# Patient Record
Sex: Female | Born: 1960
Health system: Southern US, Community
[De-identification: ages and names within clinical notes are randomized; demographics above are authoritative.]

## PROBLEM LIST (undated history)

## (undated) DIAGNOSIS — S62101A Fracture of unspecified carpal bone, right wrist, initial encounter for closed fracture: Secondary | ICD-10-CM

## (undated) DIAGNOSIS — D649 Anemia, unspecified: Secondary | ICD-10-CM

## (undated) DIAGNOSIS — Z5189 Encounter for other specified aftercare: Secondary | ICD-10-CM

## (undated) HISTORY — PX: ABDOMINAL HYSTERECTOMY: SHX81

## (undated) HISTORY — DX: Anemia, unspecified: D64.9

## (undated) HISTORY — PX: POLYPECTOMY: SHX149

## (undated) HISTORY — DX: Fracture of unspecified carpal bone, right wrist, initial encounter for closed fracture: S62.101A

## (undated) HISTORY — PX: MOUTH SURGERY: SHX715

## (undated) HISTORY — PX: COLONOSCOPY: SHX174

## (undated) HISTORY — DX: Encounter for other specified aftercare: Z51.89

---

## 1983-04-22 HISTORY — PX: KNEE LIGAMENT RECONSTRUCTION: SHX1895

## 1996-04-21 HISTORY — PX: BIOPSY BREAST: PRO8

## 2005-04-21 DIAGNOSIS — S62101A Fracture of unspecified carpal bone, right wrist, initial encounter for closed fracture: Secondary | ICD-10-CM

## 2005-04-21 HISTORY — DX: Fracture of unspecified carpal bone, right wrist, initial encounter for closed fracture: S62.101A

## 2005-11-16 ENCOUNTER — Emergency Department (HOSPITAL_COMMUNITY): Admission: EM | Admit: 2005-11-16 | Discharge: 2005-11-16 | Payer: Self-pay | Admitting: Emergency Medicine

## 2009-01-10 ENCOUNTER — Encounter: Payer: Self-pay | Admitting: Internal Medicine

## 2009-01-11 ENCOUNTER — Encounter: Payer: Self-pay | Admitting: Internal Medicine

## 2009-08-20 ENCOUNTER — Ambulatory Visit: Payer: Self-pay | Admitting: Internal Medicine

## 2009-08-20 DIAGNOSIS — R002 Palpitations: Secondary | ICD-10-CM | POA: Insufficient documentation

## 2009-08-20 DIAGNOSIS — D509 Iron deficiency anemia, unspecified: Secondary | ICD-10-CM | POA: Insufficient documentation

## 2009-08-23 ENCOUNTER — Telehealth (INDEPENDENT_AMBULATORY_CARE_PROVIDER_SITE_OTHER): Payer: Self-pay | Admitting: *Deleted

## 2009-09-14 ENCOUNTER — Ambulatory Visit: Payer: Self-pay | Admitting: Cardiovascular Disease

## 2009-09-14 ENCOUNTER — Encounter: Payer: Self-pay | Admitting: Internal Medicine

## 2009-09-14 ENCOUNTER — Ambulatory Visit: Payer: Self-pay

## 2009-09-14 ENCOUNTER — Ambulatory Visit (HOSPITAL_COMMUNITY): Admission: RE | Admit: 2009-09-14 | Discharge: 2009-09-14 | Payer: Self-pay | Admitting: Internal Medicine

## 2009-09-27 ENCOUNTER — Encounter: Admission: RE | Admit: 2009-09-27 | Discharge: 2009-09-27 | Payer: Self-pay | Admitting: Obstetrics & Gynecology

## 2010-05-21 NOTE — Assessment & Plan Note (Signed)
Summary: NEW / [employer] / # / CD   Vital Signs:  Patient profile:   50 year old female Weight:      144.13 pounds O2 Sat:      97 % on Room air Temp:     97.7 degrees F oral Pulse rate:   71 / minute BP sitting:   112 / 70  (left arm) Cuff size:   large  Vitals Entered By: Lucious Groves (Aug 20, 2009 2:09 PM)  O2 Flow:  Room air CC: NP--Est care./kb   CC:  NP--Est care./kb.  History of Present Illness: The patient presents for a wellness examination   Preventive Screening-Counseling & Management  Alcohol-Tobacco     Smoking Status: never      Drug Use:  no.    Current Medications (verified): 1)  None  Allergies (verified): No Known Drug Allergies  Past History:  Past Medical History: Anemia-iron deficiency  Past Surgical History: L knee ligament replacement 1985 Breast Biopsy 1998  Family History: M  and aunt died ??MI, HTN 78 sudden death F died pneumonia 56  Social History: Occupation: Airline pilot - from [place] Married 2 chilldren; h/o parachute sports Never Smoked Alcohol use-no Drug use-no Smoking Status:  never Drug Use:  no  Review of Systems  The patient denies anorexia, fever, weight loss, weight gain, vision loss, decreased hearing, hoarseness, chest pain, syncope, dyspnea on exertion, peripheral edema, prolonged cough, headaches, hemoptysis, abdominal pain, melena, hematochezia, severe indigestion/heartburn, hematuria, incontinence, genital sores, muscle weakness, suspicious skin lesions, transient blindness, difficulty walking, depression, unusual weight change, abnormal bleeding, enlarged lymph nodes, angioedema, and breast masses.    Physical Exam  General:  Well-developed,well-nourished,in no acute distress; alert,appropriate and cooperative throughout examination Head:  Normocephalic and atraumatic without obvious abnormalities. No apparent alopecia or balding. Eyes:  No corneal or conjunctival inflammation noted. EOMI. Perrla. Funduscopic  exam benign, without hemorrhages, exudates or papilledema. Vision grossly normal. Ears:  External ear exam shows no significant lesions or deformities.  Otoscopic examination reveals clear canals, tympanic membranes are intact bilaterally without bulging, retraction, inflammation or discharge. Hearing is grossly normal bilaterally. Nose:  External nasal examination shows no deformity or inflammation. Nasal mucosa are pink and moist without lesions or exudates. Mouth:  Oral mucosa and oropharynx without lesions or exudates.  Teeth in good repair. Neck:  No deformities, masses, or tenderness noted. Lungs:  Normal respiratory effort, chest expands symmetrically. Lungs are clear to auscultation, no crackles or wheezes. Heart:  Normal rate and regular rhythm. S1 and S2 normal without gallop, murmur, click, rub or other extra sounds. Abdomen:  Bowel sounds positive,abdomen soft and non-tender without masses, organomegaly or hernias noted. Msk:  No deformity or scoliosis noted of thoracic or lumbar spine.   Pulses:  R and L carotid,radial,femoral,dorsalis pedis and posterior tibial pulses are full and equal bilaterally Extremities:  No clubbing, cyanosis, edema, or deformity noted with normal full range of motion of all joints.   Neurologic:  No cranial nerve deficits noted. Station and gait are normal. Plantar reflexes are down-going bilaterally. DTRs are symmetrical throughout. Sensory, motor and coordinative functions appear intact. Skin:  Intact without suspicious lesions or rashes Cervical Nodes:  No lymphadenopathy noted Inguinal Nodes:  No significant adenopathy Psych:  Cognition and judgment appear intact. Alert and cooperative with normal attention span and concentration. No apparent delusions, illusions, hallucinations   Impression & Recommendations:  Problem # 1:  PHYSICAL EXAMINATION (ICD-V70.0) Assessment New Health and age related issues were discussed.  Available screening tests and  vaccinations were discussed as well. Healthy life style including good diet and execise was discussed.  The labs  from before were reviewed with the patient.  EKG was nl  Problem # 2:  ANEMIA-IRON DEFICIENCY (ICD-280.9) h/o Assessment: Comment Only  Problem # 3:  PALPITATIONS (ICD-785.1) Assessment: Comment Only I'm concerned about her cardiac fam. history. Will start w/ECHO. ASA once daily. She declined Card consult. Orders: Echo Referral (Echo)  Complete Medication List: 1)  Vitamin D 1000 Unit Tabs (Cholecalciferol) .Marland Kitchen.. 1 by mouth qd 2)  Aspirin 81 Mg Tbec (Aspirin) .Marland Kitchen.. 1 by mouth qd  Patient Instructions: 1)  Please schedule a follow-up appointment in 1 year well w/labs.  Prevention & Chronic Care Immunizations   Influenza vaccine: Not documented    Tetanus booster: Not documented    Pneumococcal vaccine: Not documented  Other Screening   Pap smear: Not documented    Mammogram: Not documented   Smoking status: never  (08/20/2009)  Lipids   Total Cholesterol: Not documented   LDL: Not documented   LDL Direct: Not documented   HDL: Not documented   Triglycerides: Not documented

## 2010-05-21 NOTE — Progress Notes (Signed)
----   Converted from flag ---- ---- 08/23/2009 9:20 AM, Edman Circle wrote: appt 5/11 @ 3:00  ---- 08/23/2009 8:48 AM, Dagoberto Reef wrote: Thanks  ---- 08/23/2009 8:01 AM, Georgina Quint Plotnikov MD wrote: The following orders have been entered for this patient and placed on Admin Hold:  Type:     Referral       Code:   Echo Description:   Echo Referral Order Date:   08/20/2009   Authorized By:   Tresa Garter MD Order #:   [id number] Clinical Notes:   Type: Cardiac ECHO  Special instructions: Dx palpitation H/o sudden death in the family ------------------------------

## 2010-10-14 ENCOUNTER — Encounter: Payer: Self-pay | Admitting: Internal Medicine

## 2010-12-06 ENCOUNTER — Other Ambulatory Visit (INDEPENDENT_AMBULATORY_CARE_PROVIDER_SITE_OTHER): Payer: BC Managed Care – PPO

## 2010-12-06 ENCOUNTER — Other Ambulatory Visit: Payer: Self-pay | Admitting: Internal Medicine

## 2010-12-06 ENCOUNTER — Ambulatory Visit (INDEPENDENT_AMBULATORY_CARE_PROVIDER_SITE_OTHER): Payer: BC Managed Care – PPO | Admitting: Internal Medicine

## 2010-12-06 ENCOUNTER — Encounter: Payer: Self-pay | Admitting: Internal Medicine

## 2010-12-06 ENCOUNTER — Ambulatory Visit (INDEPENDENT_AMBULATORY_CARE_PROVIDER_SITE_OTHER)
Admission: RE | Admit: 2010-12-06 | Discharge: 2010-12-06 | Disposition: A | Discharge status: Home / Self Care | Payer: BC Managed Care – PPO | Source: Ambulatory Visit | Attending: Internal Medicine | Admitting: Internal Medicine

## 2010-12-06 DIAGNOSIS — M25562 Pain in left knee: Secondary | ICD-10-CM

## 2010-12-06 DIAGNOSIS — M25561 Pain in right knee: Secondary | ICD-10-CM

## 2010-12-06 DIAGNOSIS — Z0000 Encounter for general adult medical examination without abnormal findings: Secondary | ICD-10-CM

## 2010-12-06 DIAGNOSIS — M25569 Pain in unspecified knee: Secondary | ICD-10-CM

## 2010-12-06 DIAGNOSIS — G43909 Migraine, unspecified, not intractable, without status migrainosus: Secondary | ICD-10-CM

## 2010-12-06 LAB — CBC WITH DIFFERENTIAL/PLATELET
Basophils Relative: 0.5 % (ref 0.0–3.0)
Eosinophils Relative: 0.3 % (ref 0.0–5.0)
Hemoglobin: 13.1 g/dL (ref 12.0–15.0)
MCV: 88.1 fl (ref 78.0–100.0)
Monocytes Relative: 6.2 % (ref 3.0–12.0)
Neutro Abs: 3.9 10*3/uL (ref 1.4–7.7)
Neutrophils Relative %: 63.9 % (ref 43.0–77.0)
Platelets: 199 10*3/uL (ref 150.0–400.0)
WBC: 6 10*3/uL (ref 4.5–10.5)

## 2010-12-06 LAB — COMPREHENSIVE METABOLIC PANEL
ALT: 17 U/L (ref 0–35)
Alkaline Phosphatase: 67 U/L (ref 39–117)
Calcium: 9 mg/dL (ref 8.4–10.5)
Creatinine, Ser: 0.7 mg/dL (ref 0.4–1.2)
Potassium: 4.2 mEq/L (ref 3.5–5.1)

## 2010-12-06 LAB — URINALYSIS
Bilirubin Urine: NEGATIVE
Ketones, ur: NEGATIVE
Leukocytes, UA: NEGATIVE
Nitrite: NEGATIVE
Specific Gravity, Urine: <=1.005 (ref 1.000–1.030)
Total Protein, Urine: NEGATIVE
Urine Glucose: NEGATIVE
pH: 6 (ref 5.0–8.0)

## 2010-12-06 LAB — LIPID PANEL: VLDL: 26.2 mg/dL (ref 0.0–40.0)

## 2010-12-06 NOTE — Patient Instructions (Signed)
Chondroitin tabs for knees Vit C for bruising

## 2010-12-06 NOTE — Progress Notes (Signed)
  Subjective:    Patient ID: Kayla Tate, female    DOB: 06/10/1960, 50 y.o.   MRN: [id number]  HPI  The patient is here for a wellness exam. The patient has been doing well overall without major physical or psychological issues going on lately.  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  Negative for fever, chills, diaphoresis, activity change, appetite change, fatigue and unexpected weight change.  HENT: Negative for hearing loss, ear pain, nosebleeds, congestion, sore throat, facial swelling, rhinorrhea, sneezing, mouth sores, trouble swallowing, neck pain, neck stiffness, postnasal drip, sinus pressure and tinnitus.   Eyes: Negative for pain, discharge, redness, itching and visual disturbance.  Respiratory: Negative for cough, chest tightness, shortness of breath, wheezing and stridor.   Cardiovascular: Negative for chest pain, palpitations and leg swelling.  Gastrointestinal: Negative for nausea, diarrhea, constipation, blood in stool, abdominal distention, anal bleeding and rectal pain.  Genitourinary: Negative for dysuria, urgency, frequency, hematuria, flank pain, vaginal bleeding, vaginal discharge, difficulty urinating, genital sores and pelvic pain.  Musculoskeletal: Positive for joint swelling (L knee), arthralgias (L knee) and gait problem (L knee hurts). Negative for back pain.  Skin: Negative.  Negative for rash.  Neurological: Negative for dizziness, tremors, seizures, syncope, speech difficulty, weakness, numbness and headaches.  Hematological: Negative for adenopathy. Does not bruise/bleed easily.  Psychiatric/Behavioral: Negative for suicidal ideas, behavioral problems, sleep disturbance, dysphoric mood and decreased concentration. The patient is not  nervous/anxious.        Objective:   Physical Exam  Constitutional: She appears well-developed and well-nourished. No distress.  HENT:  Head: Normocephalic.  Right Ear: External ear normal.  Left Ear: External ear normal.  Nose: Nose normal.  Mouth/Throat: Oropharynx is clear and moist.  Eyes: Conjunctivae are normal. Pupils are equal, round, and reactive to light. Right eye exhibits no discharge. Left eye exhibits no discharge.  Neck: Normal range of motion. Neck supple. No JVD present. No tracheal deviation present. No thyromegaly present.  Cardiovascular: Normal rate, regular rhythm and normal heart sounds.   Pulmonary/Chest: No stridor. No respiratory distress. She has no wheezes.  Abdominal: Soft. Bowel sounds are normal. She exhibits no distension and no mass. There is no tenderness. There is no rebound and no guarding.  Musculoskeletal: She exhibits tenderness. She exhibits no edema.       L knee is deformed with a scar present, small effusion, stiff and a little tender w/ROM  Lymphadenopathy:    She has no cervical adenopathy.  Neurological: She displays normal reflexes. No cranial nerve deficit. She exhibits normal muscle tone. Coordination normal.  Skin: No rash noted. No erythema.  Psychiatric: She has a normal mood and affect. Her behavior is normal. Judgment and thought content normal.          Assessment & Plan:

## 2010-12-07 DIAGNOSIS — G43909 Migraine, unspecified, not intractable, without status migrainosus: Secondary | ICD-10-CM | POA: Insufficient documentation

## 2010-12-07 NOTE — Assessment & Plan Note (Signed)
NSAIDs prn Declined Ortho consult

## 2010-12-07 NOTE — Assessment & Plan Note (Signed)
GYN q 12 mo

## 2010-12-07 NOTE — Assessment & Plan Note (Signed)
Ibuprofen prn 

## 2010-12-07 NOTE — Assessment & Plan Note (Signed)
Vit D Try glucosamine

## 2010-12-09 LAB — LDL CHOLESTEROL, DIRECT: Direct LDL: 140.4 mg/dL

## 2011-01-08 ENCOUNTER — Encounter: Payer: Self-pay | Admitting: Internal Medicine

## 2011-01-08 ENCOUNTER — Ambulatory Visit (INDEPENDENT_AMBULATORY_CARE_PROVIDER_SITE_OTHER): Payer: BC Managed Care – PPO | Admitting: Internal Medicine

## 2011-01-08 ENCOUNTER — Telehealth: Payer: Self-pay | Admitting: *Deleted

## 2011-01-08 VITALS — BP 112/74 | HR 61 | Temp 97.9°F | Resp 16 | Wt 139.2 lb

## 2011-01-08 DIAGNOSIS — T63461A Toxic effect of venom of wasps, accidental (unintentional), initial encounter: Secondary | ICD-10-CM

## 2011-01-08 DIAGNOSIS — T63441A Toxic effect of venom of bees, accidental (unintentional), initial encounter: Secondary | ICD-10-CM

## 2011-01-08 DIAGNOSIS — T6391XA Toxic effect of contact with unspecified venomous animal, accidental (unintentional), initial encounter: Secondary | ICD-10-CM

## 2011-01-08 MED ORDER — CETIRIZINE HCL 10 MG PO TABS: 10.0000 mg | ORAL_TABLET | Freq: Every day | ORAL | Status: DC

## 2011-01-08 NOTE — Assessment & Plan Note (Signed)
She was given depo-medrol IM and will start zyrtec

## 2011-01-08 NOTE — Telephone Encounter (Signed)
Spoke w/Husband. He spoke w/pt who is currently at work. Pt was stung by a bee 2 days ago. Now c/o itching and redness that has increased into her hand. NO SOB or throat tightness. Scheduled for OV today for eval.

## 2011-01-08 NOTE — Patient Instructions (Signed)
Allergic Reaction, Localized, Insect An insect sting can cause pain, redness and itching at the sting site. Symptoms of a local reaction are usually contained to the area of the sting site. A localized allergic reaction usually occurs within minutes of an insect sting.  SYMPTOMS   A local reaction at the sting site can cause:   Pain.   Redness.   Itching.   Swelling.   A systematic reaction can cause a reaction anywhere on your body. For example, you may develop the following:   Hives.   Generalized swelling.   Body aches.   Itching.   Dizziness.   Nausea or vomiting.   A more serious (anaphylactic) reaction can involve:   Difficulty breathing or wheezing.   Tongue or throat swelling.   Fainting.  HOME CARE INSTRUCTIONS  If you are stung, look to see if the stinger is still in the skin. This can appear as a small black dot at the sting site. The stinger can be removed by scraping it with a dull object such as a credit card or your fingernail. Do not use tweezers. Tweezers can squeeze the stinger and release more insect venom into the skin.   After the stinger has been removed, wash the sting site with soap and water or rubbing alcohol.   Apply ice to the area of the sting for 100.5 minutes. Place ice in a bag and put a towel between the bag and your skin. Do this 100.5 times per day. Applying ice can help relieve pain to the sting site.   Redness and swelling of the sting site may last as long as a week   A topical anti-itch cream like hydrocortisone cream can help reduce skin itching.   An oral anti-histamine medication can help decrease swelling or other related symptoms.   Only take (or give your child) over -the-counter or prescription medicines for pain, discomfort, or fever as told by your caregiver.  FOLLOW-UP CARE  If prescribed, carry an injectable epinephrine (EpiPen) with you at all times! It is important to know how and when to use the EpiPen.    Avoid contact with stinging insects or the insect thought to have caused this reaction.   Wear long pants when mowing grass or hiking. Wear gloves when gardening.   Use unscented deodorant and avoid strong perfumes when outdoors.   Wear a medical identification bracelet or necklace that describes your allergies or medical conditions.   Make sure your primary caregiver has a record of your insect sting reaction.   It may be helpful consult with an allergist. You may have other sensitivities that you are not aware of. To locate an allergist near you, contact:   The American Academy of Allergy, Asthma & Immunology [www.aaaai.org / (800) 617-628-8516] or   The American College of Allergy, Asthma & Immunology [www.acaai.org / (800) (929)237-6856].  seek immediate medical care IF:  The following may be early warning signs of a serious generalized or anaphylactic reaction. Call your local emergency service immediately!   You experience wheezing and/or difficulty breathing.   You have difficulty swallowing, or throat tightness.   You have mouth, tongue or throat swelling.   You feel weak, faint or pass out.   You have coughing or a change in your voice.   You experience vomiting, diarrhea, or stomach cramps.   You have chest pain or lightheadedness.   You notice raised red patches on the skin that itch.  MAKE SURE YOU:  Understand these instructions.   Will watch your condition.   Will get help right away if you are not doing well or get worse.  Document Released: 03/06/2006 Document Re-Released: 04/29/2009 [facility] Patient Information 2011 [place], [place].

## 2011-01-08 NOTE — Progress Notes (Signed)
  Subjective:    Patient ID: Kayla Tate, female    DOB: July 13, 1960, 50 y.o.   MRN: [id number]  HPI She complains of a bee sting on her right hand 5 days ago that itches and the swelling has spread to the right ring finger, she has not started any meds at home for the symptoms or the allergic reaction.   Review of Systems  Constitutional: Negative.   HENT: Negative.   Eyes: Negative.   Respiratory: Negative.   Cardiovascular: Negative.   Gastrointestinal: Negative.   Genitourinary: Negative.   Musculoskeletal: Negative.   Skin: Positive for color change (right hand). Negative for pallor, rash and wound.  Neurological: Negative.   Hematological: Negative.   Psychiatric/Behavioral: Negative.        Objective:   Physical Exam  Vitals reviewed. Constitutional: She is oriented to person, place, and time. She appears well-developed and well-nourished. No distress.  HENT:  Mouth/Throat: Oropharynx is clear and moist. No oropharyngeal exudate.  Eyes: Conjunctivae are normal. Right eye exhibits no discharge. Left eye exhibits no discharge. No scleral icterus.  Neck: Normal range of motion. Neck supple. No JVD present. No tracheal deviation present. No thyromegaly present.  Cardiovascular: Normal rate, regular rhythm, normal heart sounds and intact distal pulses.  Exam reveals no gallop and no friction rub.   No murmur heard. Pulmonary/Chest: Effort normal and breath sounds normal. No stridor. No respiratory distress. She has no wheezes. She has no rales. She exhibits no tenderness.  Abdominal: Soft. Bowel sounds are normal. She exhibits no distension and no mass. There is no tenderness. There is no rebound and no guarding.  Musculoskeletal: Normal range of motion. She exhibits no edema and no tenderness.       Right hand: She exhibits deformity and swelling. She exhibits normal range of motion, no bony tenderness and no laceration.       Hands:      Right hand shows mild erythema  and swelling from the dorsum of the 4th MCP joint onto the ring finger, there is no warmth, exudate, pustules, vesicles, wounds, streaking, or ttp. She has good capillary refill in the ring finger and there is good flexion and extension  Lymphadenopathy:    She has no cervical adenopathy.  Neurological: She is oriented to person, place, and time. She displays normal reflexes. She exhibits normal muscle tone.  Skin: Skin is warm and dry. No rash noted. She is not diaphoretic. No erythema. No pallor.  Psychiatric: She has a normal mood and affect. Her behavior is normal. Judgment and thought content normal.          Assessment & Plan:

## 2011-02-14 ENCOUNTER — Ambulatory Visit: Payer: BC Managed Care – PPO | Admitting: Internal Medicine

## 2011-11-27 ENCOUNTER — Other Ambulatory Visit: Payer: Self-pay | Admitting: Obstetrics & Gynecology

## 2011-11-27 DIAGNOSIS — Z1231 Encounter for screening mammogram for malignant neoplasm of breast: Secondary | ICD-10-CM

## 2011-12-08 ENCOUNTER — Ambulatory Visit
Admission: RE | Admit: 2011-12-08 | Discharge: 2011-12-08 | Disposition: A | Discharge status: Home / Self Care | Payer: BC Managed Care – PPO | Source: Ambulatory Visit | Attending: Obstetrics & Gynecology | Admitting: Obstetrics & Gynecology

## 2011-12-08 DIAGNOSIS — Z1231 Encounter for screening mammogram for malignant neoplasm of breast: Secondary | ICD-10-CM

## 2012-01-19 ENCOUNTER — Encounter: Payer: BC Managed Care – PPO | Admitting: Internal Medicine

## 2012-03-16 ENCOUNTER — Ambulatory Visit (INDEPENDENT_AMBULATORY_CARE_PROVIDER_SITE_OTHER): Payer: BC Managed Care – PPO | Admitting: Internal Medicine

## 2012-03-16 ENCOUNTER — Encounter: Payer: Self-pay | Admitting: Internal Medicine

## 2012-03-16 VITALS — BP 108/70 | HR 60 | Temp 97.6°F | Resp 16 | Ht 64.0 in | Wt 140.0 lb

## 2012-03-16 DIAGNOSIS — Z0000 Encounter for general adult medical examination without abnormal findings: Secondary | ICD-10-CM

## 2012-03-16 DIAGNOSIS — Z1211 Encounter for screening for malignant neoplasm of colon: Secondary | ICD-10-CM

## 2012-03-16 DIAGNOSIS — G43909 Migraine, unspecified, not intractable, without status migrainosus: Secondary | ICD-10-CM

## 2012-03-16 NOTE — Assessment & Plan Note (Signed)
We discussed age appropriate health related issues, including available/recomended screening tests and vaccinations. We discussed a need for adhering to healthy diet and exercise. Labs/EKG were reviewed/ordered. All questions were answered. Colonosc at 51 yo Opth, GYN, mammo - q 1 year Zostavax info Declined other shots

## 2012-03-16 NOTE — Progress Notes (Signed)
Subjective:    Patient ID: Kayla Tate, female    DOB: [birth year], 51 y.o.   MRN: [id number]  HPI  The patient is here for a wellness exam. The patient has been doing well overall without major physical or psychological issues going on lately.  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  Negative for fever, chills, diaphoresis, activity change, appetite change, fatigue and unexpected weight change.  HENT: Negative for hearing loss, ear pain, nosebleeds, congestion, sore throat, facial swelling, rhinorrhea, sneezing, mouth sores, trouble swallowing, neck pain, neck stiffness, postnasal drip, sinus pressure and tinnitus.   Eyes: Negative for pain, discharge, redness, itching and visual disturbance.  Respiratory: Negative for cough, chest tightness, shortness of breath, wheezing and stridor.   Cardiovascular: Negative for chest pain, palpitations and leg swelling.  Gastrointestinal: Negative for nausea, diarrhea, constipation, blood in stool, abdominal distention, anal bleeding and rectal pain.  Genitourinary: Negative for dysuria, urgency, frequency, hematuria, flank pain, vaginal bleeding, vaginal discharge, difficulty urinating, genital sores and pelvic pain.  Musculoskeletal: Positive for joint swelling (L knee), arthralgias (L knee) and gait problem (L knee hurts). Negative for back pain.  Skin: Negative.  Negative for rash.  Neurological: Negative for dizziness, tremors, seizures, syncope, speech difficulty, weakness, numbness and headaches.    Hematological: Negative for adenopathy. Does not bruise/bleed easily.  Psychiatric/Behavioral: Negative for suicidal ideas, behavioral problems, sleep disturbance, dysphoric mood and decreased concentration. The patient is not nervous/anxious.        Objective:   Physical Exam  Constitutional: She appears well-developed and well-nourished. No distress.  HENT:  Head: Normocephalic.  Right Ear: External ear normal.  Left Ear: External ear normal.  Nose: Nose normal.  Mouth/Throat: Oropharynx is clear and moist.  Eyes: Conjunctivae normal are normal. Pupils are equal, round, and reactive to light. Right eye exhibits no discharge. Left eye exhibits no discharge.  Neck: Normal range of motion. Neck supple. No JVD present. No tracheal deviation present. No thyromegaly present.  Cardiovascular: Normal rate, regular rhythm and normal heart sounds.   Pulmonary/Chest: No stridor. No respiratory distress. She has no wheezes.  Abdominal: Soft. Bowel sounds are normal. She exhibits no distension and no mass. There is no tenderness. There is no rebound and no guarding.  Musculoskeletal: She exhibits tenderness. She exhibits no edema.       L knee is deformed with a scar present, small effusion, stiff and a little tender w/ROM  Lymphadenopathy:    She has no cervical adenopathy.  Neurological: She displays normal reflexes. No cranial nerve deficit. She exhibits normal muscle tone. Coordination normal.  Skin: No rash noted. No erythema.  Psychiatric: She has a normal mood and affect. Her behavior is normal. Judgment and thought content normal.    Lab Results  Component Value Date   WBC 6.0 12/06/2010   HGB 13.1 12/06/2010   HCT 39.5 12/06/2010   PLT 199.0 12/06/2010   GLUCOSE 90 12/06/2010   CHOL 210* 12/06/2010  TRIG 131.0 12/06/2010   HDL 53.10 12/06/2010   LDLDIRECT 140.4 12/06/2010   ALT 17 12/06/2010   AST 25 12/06/2010   NA 139 12/06/2010   K 4.2 12/06/2010   CL 104 12/06/2010   CREATININE 0.7  12/06/2010   BUN 13 12/06/2010   CO2 29 12/06/2010   TSH 2.57 12/06/2010         Assessment & Plan:

## 2012-03-16 NOTE — Assessment & Plan Note (Signed)
Ibuprofen prn 

## 2012-06-05 ENCOUNTER — Other Ambulatory Visit: Payer: Self-pay

## 2012-11-09 ENCOUNTER — Ambulatory Visit (INDEPENDENT_AMBULATORY_CARE_PROVIDER_SITE_OTHER): Payer: BC Managed Care – PPO | Admitting: Internal Medicine

## 2012-11-09 ENCOUNTER — Encounter: Payer: Self-pay | Admitting: Internal Medicine

## 2012-11-09 VITALS — BP 130/88 | HR 76 | Temp 98.0°F | Resp 16 | Wt 132.0 lb

## 2012-11-09 DIAGNOSIS — IMO0002 Reserved for concepts with insufficient information to code with codable children: Secondary | ICD-10-CM | POA: Insufficient documentation

## 2012-11-09 DIAGNOSIS — L03011 Cellulitis of right finger: Secondary | ICD-10-CM

## 2012-11-09 MED ORDER — DOXYCYCLINE HYCLATE 100 MG PO TABS: 100.0000 mg | ORAL_TABLET | Freq: Two times a day (BID) | ORAL | Status: DC

## 2012-11-09 MED ORDER — MUPIROCIN 2 % EX OINT: TOPICAL_OINTMENT | CUTANEOUS | Status: DC

## 2012-11-09 NOTE — Patient Instructions (Signed)
Soak in hypertonic saline or Absent salt 3 times a day

## 2012-11-09 NOTE — Assessment & Plan Note (Addendum)
7/14 vs wart See procedure Empiric abx tDAP

## 2012-11-09 NOTE — Progress Notes (Signed)
  Subjective:    Patient ID: Kayla Tate, female    DOB: [birth year], 52 y.o.   MRN: [id number]  HPI  C/o R 4th dist phalanx - pain and swelling x 2 mo. There was a possibility of a thorn prick prior. There is a callus over this painful swelling   Review of Systems  Constitutional: Negative for fever.  Musculoskeletal: Negative for myalgias and arthralgias.       Objective:   Physical Exam  Constitutional: She appears well-developed. No distress.  Musculoskeletal: She exhibits tenderness. She exhibits no edema.  R 4th dist phalanx - pain and swelling, hyperkeratotic  Skin: No rash noted.  Psychiatric: Judgment normal.      Procedure note:  Incision and Drainage of an Abscess   Indication : a localized collection of pus that is tender and not spontaneously resolving.    Risks including unsuccessful procedure , possible need for a repeat procedure due to pus accumulation, scar formation, and others as well as benefits were explained to the patient in detail. Written consent was obtained/signed.    The patient was placed in a sitting position. The area of a hyperkeratotic abscess was debrided from hyperkeratosis and prepped with povidone-iodine. 0.6 cm incision with #11strait blade was made. No purulent material was expressed.    The wound was dressed with antibiotic ointment and band aid.  Tolerated well. Complications: None.   Wound instructions provided.   Wound instructions : change dressing once a day or twice a day is needed. Change dressing after  shower in the morning.  Pat dry the wound with gauze. Pull out one inch of packing everyday and cut it off. Re-dress wound with antibiotic ointment and Telfa pad or a Band-Aid of appropriate size.   Please contact us if you notice a recollection of pus in the abscess fever and chills increased pain redness red streaks near the abscess increased swelling in the area.     Assessment & Plan:

## 2012-11-14 ENCOUNTER — Encounter: Payer: Self-pay | Admitting: Internal Medicine

## 2012-12-08 ENCOUNTER — Ambulatory Visit: Payer: BC Managed Care – PPO | Admitting: Internal Medicine

## 2013-02-24 ENCOUNTER — Other Ambulatory Visit: Payer: Self-pay

## 2013-06-21 ENCOUNTER — Telehealth: Payer: Self-pay | Admitting: Internal Medicine

## 2013-06-21 DIAGNOSIS — Z1211 Encounter for screening for malignant neoplasm of colon: Secondary | ICD-10-CM

## 2013-06-21 NOTE — Telephone Encounter (Signed)
Ok Thx 

## 2013-06-21 NOTE — Telephone Encounter (Signed)
Pt called request referral for colonoscopy. Pt stated that Dr. Camila Li wanted her to have it done since last ov for a routine check up. Please advise.

## 2013-06-21 NOTE — Telephone Encounter (Signed)
Left detail massage for pt.  

## 2013-06-22 ENCOUNTER — Ambulatory Visit: Payer: BC Managed Care – PPO | Admitting: Family Medicine

## 2013-09-05 ENCOUNTER — Encounter: Payer: Self-pay | Admitting: Internal Medicine

## 2013-09-05 ENCOUNTER — Ambulatory Visit (INDEPENDENT_AMBULATORY_CARE_PROVIDER_SITE_OTHER): Payer: BC Managed Care – PPO | Admitting: Internal Medicine

## 2013-09-05 VITALS — BP 122/80 | HR 72 | Temp 97.5°F | Resp 16 | Wt 144.0 lb

## 2013-09-05 DIAGNOSIS — M25562 Pain in left knee: Secondary | ICD-10-CM

## 2013-09-05 DIAGNOSIS — M25561 Pain in right knee: Secondary | ICD-10-CM

## 2013-09-05 DIAGNOSIS — M25569 Pain in unspecified knee: Secondary | ICD-10-CM

## 2013-09-05 NOTE — Progress Notes (Signed)
Pre visit review using our clinic review tool, if applicable. No additional management support is needed unless otherwise documented below in the visit note. 

## 2013-09-05 NOTE — Assessment & Plan Note (Signed)
5/15 likely reactive due to overuse Not interested in meds Sports Me ref Vit D

## 2013-09-05 NOTE — Assessment & Plan Note (Addendum)
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
## 2013-09-06 ENCOUNTER — Ambulatory Visit (INDEPENDENT_AMBULATORY_CARE_PROVIDER_SITE_OTHER): Payer: BC Managed Care – PPO | Admitting: Family Medicine

## 2013-09-06 ENCOUNTER — Other Ambulatory Visit (INDEPENDENT_AMBULATORY_CARE_PROVIDER_SITE_OTHER): Payer: BC Managed Care – PPO

## 2013-09-06 ENCOUNTER — Encounter: Payer: Self-pay | Admitting: Family Medicine

## 2013-09-06 VITALS — BP 110/70 | HR 66 | Ht 64.0 in | Wt 144.0 lb

## 2013-09-06 DIAGNOSIS — M25561 Pain in right knee: Secondary | ICD-10-CM

## 2013-09-06 DIAGNOSIS — M25569 Pain in unspecified knee: Secondary | ICD-10-CM

## 2013-09-06 DIAGNOSIS — M25562 Pain in left knee: Principal | ICD-10-CM

## 2013-09-06 MED ORDER — MELOXICAM 15 MG PO TABS: 15.0000 mg | ORAL_TABLET | Freq: Every day | ORAL | Status: DC

## 2013-09-06 NOTE — Assessment & Plan Note (Signed)
Patient does have what appears to be a potential meniscal injury but on physical exam patient has no meniscal signs. Patient's history though could correspond to this. The patient was given a brace was fitted by me today. Patient was given a home exercise program as well as icing. Patient does have a knee effusion and I think would feel significantly better after aspiration the patient declined today. Patient is to try these interventions and come back in 2 weeks. At that time is continuing to have pain we will likely do an aspiration and possible steroid injection.

## 2013-09-06 NOTE — Patient Instructions (Signed)
Very nice to meet you Try brace with activity  Ice 20 minutes 2 times daily.  You do have moderate arthritis of your left knee. Mild arthritis of right knee.  Vitamin D 2000 IU daily Turmeric 500mg  twice daily.  meloxicam daily for 10 days Wear good shoes at all times Bronwen Betters, Lillia [place], New balance.  [employer] orthotics at [employer] or online.  Come back in 2 weeks and we can do injection if not better.
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
## 2013-09-30 ENCOUNTER — Ambulatory Visit: Payer: BC Managed Care – PPO | Admitting: Family Medicine

## 2014-01-02 ENCOUNTER — Encounter: Payer: Self-pay | Admitting: Internal Medicine

## 2014-01-02 ENCOUNTER — Other Ambulatory Visit (INDEPENDENT_AMBULATORY_CARE_PROVIDER_SITE_OTHER): Payer: BC Managed Care – PPO

## 2014-01-02 ENCOUNTER — Ambulatory Visit (INDEPENDENT_AMBULATORY_CARE_PROVIDER_SITE_OTHER): Payer: BC Managed Care – PPO | Admitting: Internal Medicine

## 2014-01-02 VITALS — BP 136/98 | HR 70 | Temp 98.0°F | Ht 64.0 in | Wt 139.0 lb

## 2014-01-02 DIAGNOSIS — Z0000 Encounter for general adult medical examination without abnormal findings: Secondary | ICD-10-CM

## 2014-01-02 DIAGNOSIS — H04123 Dry eye syndrome of bilateral lacrimal glands: Secondary | ICD-10-CM

## 2014-01-02 DIAGNOSIS — H04129 Dry eye syndrome of unspecified lacrimal gland: Secondary | ICD-10-CM

## 2014-01-02 LAB — CBC WITH DIFFERENTIAL/PLATELET
BASOS ABS: 0 10*3/uL (ref 0.0–0.1)
Basophils Relative: 0.4 % (ref 0.0–3.0)
EOS ABS: 0 10*3/uL (ref 0.0–0.7)
Eosinophils Relative: 0.8 % (ref 0.0–5.0)
HCT: 41.9 % (ref 36.0–46.0)
Hemoglobin: 14 g/dL (ref 12.0–15.0)
LYMPHS ABS: 2.5 10*3/uL (ref 0.7–4.0)
Lymphocytes Relative: 48 % — ABNORMAL HIGH (ref 12.0–46.0)
MCHC: 33.5 g/dL (ref 30.0–36.0)
MCV: 90.3 fl (ref 78.0–100.0)
MONOS PCT: 7.9 % (ref 3.0–12.0)
Monocytes Absolute: 0.4 10*3/uL (ref 0.1–1.0)
NEUTROS ABS: 2.2 10*3/uL (ref 1.4–7.7)
Neutrophils Relative %: 42.9 % — ABNORMAL LOW (ref 43.0–77.0)
PLATELETS: 182 10*3/uL (ref 150.0–400.0)
RBC: 4.64 Mil/uL (ref 3.87–5.11)
RDW: 13.4 % (ref 11.5–15.5)
WBC: 5.1 10*3/uL (ref 4.0–10.5)

## 2014-01-02 LAB — LIPID PANEL
CHOLESTEROL: 222 mg/dL — AB (ref 0–200)
HDL: 59.5 mg/dL (ref >39.00)
LDL CALC: 152 mg/dL — AB (ref 0–99)
NONHDL: 162.5
Total CHOL/HDL Ratio: 4
Triglycerides: 51 mg/dL (ref 0.0–149.0)
VLDL: 10.2 mg/dL (ref 0.0–40.0)

## 2014-01-02 LAB — HEPATIC FUNCTION PANEL
ALK PHOS: 73 U/L (ref 39–117)
ALT: 22 U/L (ref 0–35)
AST: 31 U/L (ref 0–37)
Albumin: 4.2 g/dL (ref 3.5–5.2)
Bilirubin, Direct: 0 mg/dL (ref 0.0–0.3)
TOTAL PROTEIN: 7.6 g/dL (ref 6.0–8.3)
Total Bilirubin: 0.6 mg/dL (ref 0.2–1.2)

## 2014-01-02 LAB — URINALYSIS
BILIRUBIN URINE: NEGATIVE
Hgb urine dipstick: NEGATIVE
KETONES UR: NEGATIVE
LEUKOCYTES UA: NEGATIVE
NITRITE: NEGATIVE
Specific Gravity, Urine: <=1.005 — AB (ref 1.000–1.030)
Total Protein, Urine: NEGATIVE
UROBILINOGEN UA: 0.2 (ref 0.0–1.0)
Urine Glucose: NEGATIVE
pH: 7 (ref 5.0–8.0)

## 2014-01-02 LAB — BASIC METABOLIC PANEL
BUN: 13 mg/dL (ref 6–23)
CHLORIDE: 103 meq/L (ref 96–112)
CO2: 29 meq/L (ref 19–32)
Calcium: 9.4 mg/dL (ref 8.4–10.5)
Creatinine, Ser: 0.7 mg/dL (ref 0.4–1.2)
GFR: 91.99 mL/min (ref >60.00)
GLUCOSE: 89 mg/dL (ref 70–99)
POTASSIUM: 5 meq/L (ref 3.5–5.1)
SODIUM: 139 meq/L (ref 135–145)

## 2014-01-02 LAB — TSH: TSH: 3.19 u[IU]/mL (ref 0.35–4.50)

## 2014-01-02 MED ORDER — ASPIRIN EC 81 MG PO TBEC: 81.0000 mg | DELAYED_RELEASE_TABLET | Freq: Every day | ORAL | Status: DC

## 2014-01-02 NOTE — Patient Instructions (Signed)
Cologuard

## 2014-01-02 NOTE — Progress Notes (Signed)
Pre visit review using our clinic review tool, if applicable. No additional management support is needed unless otherwise documented below in the visit note. 
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
## 2014-01-03 NOTE — Assessment & Plan Note (Signed)
  We discussed age appropriate health related issues, including available/recomended screening tests and vaccinations. We discussed a need for adhering to healthy diet and exercise. Labs/EKG were reviewed/ordered. All questions were answered. Cologuard info Opth, GYN, mammo - q 1 year Zostavax info Declined other shots

## 2014-02-13 ENCOUNTER — Other Ambulatory Visit: Payer: Self-pay

## 2014-02-13 DIAGNOSIS — Z1231 Encounter for screening mammogram for malignant neoplasm of breast: Secondary | ICD-10-CM

## 2014-02-14 ENCOUNTER — Encounter: Payer: Self-pay | Admitting: Internal Medicine

## 2014-02-16 ENCOUNTER — Other Ambulatory Visit: Payer: Self-pay | Admitting: Internal Medicine

## 2014-02-16 DIAGNOSIS — Z1211 Encounter for screening for malignant neoplasm of colon: Secondary | ICD-10-CM

## 2014-02-22 ENCOUNTER — Encounter: Payer: Self-pay | Admitting: Internal Medicine

## 2014-02-28 ENCOUNTER — Ambulatory Visit
Admission: RE | Admit: 2014-02-28 | Discharge: 2014-02-28 | Disposition: A | Discharge status: Home / Self Care | Payer: BC Managed Care – PPO | Source: Ambulatory Visit

## 2014-02-28 DIAGNOSIS — Z1231 Encounter for screening mammogram for malignant neoplasm of breast: Secondary | ICD-10-CM

## 2014-03-22 ENCOUNTER — Ambulatory Visit (AMBULATORY_SURGERY_CENTER): Payer: Self-pay | Admitting: *Deleted

## 2014-03-22 VITALS — Ht 64.0 in | Wt 140.4 lb

## 2014-03-22 DIAGNOSIS — Z1211 Encounter for screening for malignant neoplasm of colon: Secondary | ICD-10-CM

## 2014-03-22 MED ORDER — MOVIPREP 100 G PO SOLR: 1.0000 | Freq: Once | ORAL | Status: DC

## 2014-03-22 NOTE — Progress Notes (Signed)
No egg or soy allergy. ewm no diet pills. ewm No blood thinners. ewm No issues with past sedation. ewm No issues with constipation. ewm emmi video. ewm

## 2014-03-29 ENCOUNTER — Encounter: Payer: BC Managed Care – PPO | Admitting: Internal Medicine

## 2014-04-05 ENCOUNTER — Encounter: Payer: Self-pay | Admitting: Internal Medicine

## 2014-04-05 ENCOUNTER — Ambulatory Visit (AMBULATORY_SURGERY_CENTER): Payer: BC Managed Care – PPO | Admitting: Internal Medicine

## 2014-04-05 VITALS — BP 98/63 | HR 57 | Temp 96.7°F | Resp 20 | Ht 64.0 in | Wt 140.0 lb

## 2014-04-05 DIAGNOSIS — D122 Benign neoplasm of ascending colon: Secondary | ICD-10-CM

## 2014-04-05 DIAGNOSIS — D124 Benign neoplasm of descending colon: Secondary | ICD-10-CM

## 2014-04-05 DIAGNOSIS — Z1211 Encounter for screening for malignant neoplasm of colon: Secondary | ICD-10-CM

## 2014-04-05 MED ORDER — SODIUM CHLORIDE 0.9 % IV SOLN: 500.0000 mL | INTRAVENOUS | Status: DC

## 2014-04-05 NOTE — Patient Instructions (Signed)

## 2014-04-05 NOTE — Progress Notes (Signed)
Called to room to assist during endoscopic procedure.  Patient ID and intended procedure confirmed with present staff. Received instructions for my participation in the procedure from the performing physician.  

## 2014-04-05 NOTE — Progress Notes (Signed)
Report to PACU, RN, vss, BBS= Clear.  

## 2014-04-05 NOTE — Op Note (Addendum)
[facility]  [address] [place], [address]   COLONOSCOPY PROCEDURE REPORT  PATIENT: Kayla Tate, Kayla Tate  MR#: [id number] BIRTHDATE: [birth year] , 52  yrs. old GENDER: female ENDOSCOPIST: Lafayette Dragon, MD REFERRED GP:QDIY Avel Sensor, M.D. PROCEDURE DATE:  04/05/2014 PROCEDURE:   Colonoscopy with snare polypectomy First Screening Colonoscopy - Avg.  risk and is 50 yrs.  old or older Yes.  Prior Negative Screening - Now for repeat screening. N/A  History of Adenoma - Now for follow-up colonoscopy & has been > or = to 3 yrs.  N/A  Polyps Removed Today? Yes. ASA CLASS:   Class I INDICATIONS:average risk for colon cancer. MEDICATIONS: Monitored anesthesia care and Propofol 330  DESCRIPTION OF PROCEDURE:   After the risks benefits and alternatives of the procedure were thoroughly explained, informed consent was obtained.  The digital rectal exam revealed no abnormalities of the rectum.   The LB [id number]  endoscope was introduced through the anus and advanced to the cecum, which was identified by both the appendix and ileocecal valve. No adverse events experienced.   The quality of the prep was excellent, using MoviPrep  The instrument was then slowly withdrawn as the colon was fully examined.      COLON FINDINGS: A semi-pedunculated polyp measuring 7 mm in size was found in the descending colon.  A polypectomy was performed with a cold snare.  Retroflexed views revealed no abnormalities. The time to cecum=11 minutes 12 seconds.  Withdrawal time=10 minutes 12 seconds.  The scope was withdrawn and the procedure completed. COMPLICATIONS: There were no immediate complications.  ENDOSCOPIC IMPRESSION: Semi-pedunculated polyp was found in the descending colon; polypectomy was performed with a cold snare  RECOMMENDATIONS: 1.  Await pathology results 2.  High-fiber diet 3.Recall colonoscopy in 5 years  eSigned:  Lafayette Dragon, MD 04/05/2014 8:43  AM Revised: 04/05/2014 8:43 AM  cc:

## 2014-04-06 ENCOUNTER — Telehealth: Payer: Self-pay | Admitting: *Deleted

## 2014-04-06 NOTE — Telephone Encounter (Signed)
  Follow up Call-  Call back number 04/05/2014  Post procedure Call Back phone  # cell [phone]     Patient questions:  Do you have a fever, pain , or abdominal swelling? No. Pain Score  0 *  Have you tolerated food without any problems? Yes.    Have you been able to return to your normal activities? Yes.    Do you have any questions about your discharge instructions: Diet   No. Medications  No. Follow up visit  No.  Do you have questions or concerns about your Care? No.  Actions: * If pain score is 4 or above: No action needed, pain <4.

## 2014-04-11 ENCOUNTER — Encounter: Payer: Self-pay | Admitting: Internal Medicine

## 2015-05-23 ENCOUNTER — Ambulatory Visit (INDEPENDENT_AMBULATORY_CARE_PROVIDER_SITE_OTHER)
Admission: RE | Admit: 2015-05-23 | Discharge: 2015-05-23 | Disposition: A | Discharge status: Home / Self Care | Payer: BLUE CROSS/BLUE SHIELD | Source: Ambulatory Visit | Attending: Internal Medicine | Admitting: Internal Medicine

## 2015-05-23 ENCOUNTER — Encounter: Payer: Self-pay | Admitting: Internal Medicine

## 2015-05-23 ENCOUNTER — Other Ambulatory Visit (INDEPENDENT_AMBULATORY_CARE_PROVIDER_SITE_OTHER): Payer: BLUE CROSS/BLUE SHIELD

## 2015-05-23 ENCOUNTER — Ambulatory Visit (INDEPENDENT_AMBULATORY_CARE_PROVIDER_SITE_OTHER): Payer: BLUE CROSS/BLUE SHIELD | Admitting: Internal Medicine

## 2015-05-23 VITALS — BP 130/74 | HR 70 | Ht 64.0 in | Wt 141.0 lb

## 2015-05-23 DIAGNOSIS — Z0000 Encounter for general adult medical examination without abnormal findings: Secondary | ICD-10-CM

## 2015-05-23 DIAGNOSIS — R0789 Other chest pain: Secondary | ICD-10-CM

## 2015-05-23 LAB — BASIC METABOLIC PANEL
BUN: 9 mg/dL (ref 6–23)
CO2: 30 mEq/L (ref 19–32)
CREATININE: 0.74 mg/dL (ref 0.40–1.20)
Calcium: 9.9 mg/dL (ref 8.4–10.5)
Chloride: 104 mEq/L (ref 96–112)
GFR: 87.23 mL/min (ref >60.00)
Glucose, Bld: 99 mg/dL (ref 70–99)
Potassium: 4.6 mEq/L (ref 3.5–5.1)
Sodium: 141 mEq/L (ref 135–145)

## 2015-05-23 LAB — URINALYSIS
BILIRUBIN URINE: NEGATIVE
Hgb urine dipstick: NEGATIVE
Ketones, ur: NEGATIVE
Leukocytes, UA: NEGATIVE
NITRITE: NEGATIVE
Specific Gravity, Urine: <=1.005 — AB (ref 1.000–1.030)
TOTAL PROTEIN, URINE-UPE24: NEGATIVE
URINE GLUCOSE: NEGATIVE
UROBILINOGEN UA: 0.2 (ref 0.0–1.0)
pH: 6.5 (ref 5.0–8.0)

## 2015-05-23 LAB — CBC WITH DIFFERENTIAL/PLATELET
BASOS ABS: 0.1 10*3/uL (ref 0.0–0.1)
Basophils Relative: 0.7 % (ref 0.0–3.0)
EOS ABS: 0.1 10*3/uL (ref 0.0–0.7)
Eosinophils Relative: 1 % (ref 0.0–5.0)
HCT: 43 % (ref 36.0–46.0)
HEMOGLOBIN: 14.1 g/dL (ref 12.0–15.0)
Lymphocytes Relative: 38.8 % (ref 12.0–46.0)
Lymphs Abs: 2.8 10*3/uL (ref 0.7–4.0)
MCHC: 32.8 g/dL (ref 30.0–36.0)
MCV: 89.7 fl (ref 78.0–100.0)
MONO ABS: 0.6 10*3/uL (ref 0.1–1.0)
Monocytes Relative: 7.9 % (ref 3.0–12.0)
Neutro Abs: 3.7 10*3/uL (ref 1.4–7.7)
Neutrophils Relative %: 51.6 % (ref 43.0–77.0)
Platelets: 193 10*3/uL (ref 150.0–400.0)
RBC: 4.8 Mil/uL (ref 3.87–5.11)
RDW: 14.2 % (ref 11.5–15.5)
WBC: 7.2 10*3/uL (ref 4.0–10.5)

## 2015-05-23 LAB — IBC PANEL
Iron: 73 ug/dL (ref 42–145)
Saturation Ratios: 15.5 % — ABNORMAL LOW (ref 20.0–50.0)
TRANSFERRIN: 337 mg/dL (ref 212.0–360.0)

## 2015-05-23 LAB — VITAMIN D 25 HYDROXY (VIT D DEFICIENCY, FRACTURES): VITD: 29.56 ng/mL — ABNORMAL LOW (ref 30.00–100.00)

## 2015-05-23 LAB — LIPID PANEL
CHOL/HDL RATIO: 3
Cholesterol: 207 mg/dL — ABNORMAL HIGH (ref 0–200)
HDL: 63.5 mg/dL (ref >39.00)
LDL CALC: 129 mg/dL — AB (ref 0–99)
NonHDL: 143.44
TRIGLYCERIDES: 74 mg/dL (ref 0.0–149.0)
VLDL: 14.8 mg/dL (ref 0.0–40.0)

## 2015-05-23 LAB — HEPATIC FUNCTION PANEL
ALT: 19 U/L (ref 0–35)
AST: 28 U/L (ref 0–37)
Albumin: 4.3 g/dL (ref 3.5–5.2)
Alkaline Phosphatase: 96 U/L (ref 39–117)
BILIRUBIN DIRECT: 0.1 mg/dL (ref 0.0–0.3)
BILIRUBIN TOTAL: 0.5 mg/dL (ref 0.2–1.2)
Total Protein: 7.7 g/dL (ref 6.0–8.3)

## 2015-05-23 LAB — TSH: TSH: 1.63 u[IU]/mL (ref 0.35–4.50)

## 2015-05-23 LAB — VITAMIN B12: VITAMIN B 12: 755 pg/mL (ref 211–911)

## 2015-05-23 NOTE — Progress Notes (Signed)
Pre visit review using our clinic review tool, if applicable. No additional management support is needed unless otherwise documented below in the visit note. 

## 2015-05-23 NOTE — Assessment & Plan Note (Signed)
2/17 ?etiology CXR EKG

## 2015-05-23 NOTE — Progress Notes (Signed)
Subjective:  Patient ID: Kayla Tate, female    DOB: 05-Jan-1961  Age: 55 y.o. MRN: [id number]  CC: Annual Exam   HPI Kayla Tate presents for well exam. Pt took abx for teeth implants x 2 months. C/o occ CP on L side w/o exertion. No SOB. No GERD.  Outpatient Prescriptions Prior to Visit  Medication Sig Dispense Refill  . aspirin EC 81 MG tablet Take 1 tablet (81 mg total) by mouth daily. (Patient not taking: Reported on 05/23/2015) 100 tablet 3  . Cholecalciferol 1000 UNITS tablet Take 1,000 Units by mouth daily. Reported on 05/23/2015     No facility-administered medications prior to visit.    ROS Review of Systems  Constitutional: Negative for chills, activity change, appetite change, fatigue and unexpected weight change.  HENT: Negative for congestion, mouth sores and sinus pressure.   Eyes: Negative for visual disturbance.  Respiratory: Negative for cough and chest tightness.   Gastrointestinal: Negative for nausea and abdominal pain.  Genitourinary: Negative for frequency, difficulty urinating and vaginal pain.  Musculoskeletal: Negative for back pain and gait problem.  Skin: Negative for pallor and rash.  Neurological: Negative for dizziness, tremors, weakness, numbness and headaches.  Psychiatric/Behavioral: Negative for suicidal ideas, confusion and sleep disturbance.    Objective:  BP 130/74 mmHg  Pulse 70  Ht 5\' 4"  (1.626 m)  Wt 141 lb (63.957 kg)  BMI 24.19 kg/m2  SpO2 97%  BP Readings from Last 3 Encounters:  05/23/15 130/74  04/05/14 98/63  01/02/14 136/98    Wt Readings from Last 3 Encounters:  05/23/15 141 lb (63.957 kg)  04/05/14 140 lb (63.504 kg)  03/22/14 140 lb 6.4 oz (63.685 kg)    Physical Exam  Constitutional: She appears well-developed. No distress.  HENT:  Head: Normocephalic.  Right Ear: External ear normal.  Left Ear: External ear normal.  Nose: Nose normal.  Mouth/Throat: Oropharynx is clear and moist.  Eyes:  Conjunctivae are normal. Pupils are equal, round, and reactive to light. Right eye exhibits no discharge. Left eye exhibits no discharge.  Neck: Normal range of motion. Neck supple. No JVD present. No tracheal deviation present. No thyromegaly present.  Cardiovascular: Normal rate, regular rhythm and normal heart sounds.   Pulmonary/Chest: No stridor. No respiratory distress. She has no wheezes.  Abdominal: Soft. Bowel sounds are normal. She exhibits no distension and no mass. There is no tenderness. There is no rebound and no guarding.  Musculoskeletal: She exhibits no edema or tenderness.  Lymphadenopathy:    She has no cervical adenopathy.  Neurological: She displays normal reflexes. No cranial nerve deficit. She exhibits normal muscle tone. Coordination normal.  Skin: No rash noted. No erythema.  Psychiatric: She has a normal mood and affect. Her behavior is normal. Judgment and thought content normal.   EKG NSR  Lab Results  Component Value Date   WBC 7.2 05/23/2015   HGB 14.1 05/23/2015   HCT 43.0 05/23/2015   PLT 193.0 05/23/2015   GLUCOSE 99 05/23/2015   CHOL 207* 05/23/2015   TRIG 74.0 05/23/2015   HDL 63.50 05/23/2015   LDLDIRECT 140.4 12/06/2010   LDLCALC 129* 05/23/2015   ALT 19 05/23/2015   AST 28 05/23/2015   NA 141 05/23/2015   K 4.6 05/23/2015   CL 104 05/23/2015   CREATININE 0.74 05/23/2015   BUN 9 05/23/2015   CO2 30 05/23/2015   TSH 1.63 05/23/2015    Mm Digital Screening Bilateral  03/01/2014  CLINICAL DATA:  Screening. EXAM:  DIGITAL SCREENING BILATERAL MAMMOGRAM WITH CAD COMPARISON:  Previous exam(s). ACR Breast Density Category c: The breast tissue is heterogeneously dense, which may obscure small masses. FINDINGS: There are no findings suspicious for malignancy. Images were processed with CAD. IMPRESSION: No mammographic evidence of malignancy. A result letter of this screening mammogram will be mailed directly to the patient. RECOMMENDATION: Screening  mammogram in one year. (Code:SM-B-01Y) BI-RADS CATEGORY  1: Negative. Electronically Signed   By: Lajean Manes M.D.   On: 03/01/2014 14:38    Assessment & Plan:   Kayla Tate was seen today for annual exam.  Diagnoses and all orders for this visit:  Well adult exam -     VITAMIN D 25 Hydroxy (Vit-D Deficiency, Fractures); Future -     Vitamin B12; Future -     Urinalysis; Future -     TSH; Future -     Lipid panel; Future -     Basic metabolic panel; Future -     CBC with Differential/Platelet; Future -     Hepatic function panel; Future -     IBC panel; Future -     DG Chest 2 View  Chest pain, atypical -     EKG 12-Lead -     VITAMIN D 25 Hydroxy (Vit-D Deficiency, Fractures); Future -     Vitamin B12; Future -     Urinalysis; Future -     TSH; Future -     Lipid panel; Future -     Basic metabolic panel; Future -     CBC with Differential/Platelet; Future -     Hepatic function panel; Future -     IBC panel; Future -     DG Chest 2 View   I am having Ms. Sagert maintain her Cholecalciferol and aspirin EC.  No orders of the defined types were placed in this encounter.     Follow-up: Return in about 4 months (around 09/20/2015) for Wellness Exam.  Walker Kehr, MD

## 2015-05-23 NOTE — Assessment & Plan Note (Signed)
We discussed age appropriate health related issues, including available/recomended screening tests and vaccinations. We discussed a need for adhering to healthy diet and exercise. Labs/EKG were reviewed/ordered. All questions were answered.   We discussed age appropriate health related issues, including available/recomended screening tests and vaccinations. We discussed a need for adhering to healthy diet and exercise. Labs/EKG were reviewed/ordered. All questions were answered. Cologuard info Opth, GYN, mammo - q 1 year Zostavax info

## 2015-05-28 MED ORDER — ERGOCALCIFEROL 1.25 MG (50000 UT) PO CAPS: 50000.0000 [IU] | ORAL_CAPSULE | ORAL | Status: DC

## 2015-08-01 ENCOUNTER — Ambulatory Visit: Payer: Self-pay | Admitting: Internal Medicine

## 2016-02-19 ENCOUNTER — Ambulatory Visit (INDEPENDENT_AMBULATORY_CARE_PROVIDER_SITE_OTHER)
Admission: RE | Admit: 2016-02-19 | Discharge: 2016-02-19 | Disposition: A | Discharge status: Home / Self Care | Payer: BLUE CROSS/BLUE SHIELD | Source: Ambulatory Visit | Attending: Internal Medicine | Admitting: Internal Medicine

## 2016-02-19 ENCOUNTER — Encounter: Payer: Self-pay | Admitting: Internal Medicine

## 2016-02-19 ENCOUNTER — Ambulatory Visit (INDEPENDENT_AMBULATORY_CARE_PROVIDER_SITE_OTHER): Payer: BLUE CROSS/BLUE SHIELD | Admitting: Internal Medicine

## 2016-02-19 DIAGNOSIS — R05 Cough: Secondary | ICD-10-CM | POA: Diagnosis not present

## 2016-02-19 DIAGNOSIS — R059 Cough, unspecified: Secondary | ICD-10-CM | POA: Insufficient documentation

## 2016-02-19 DIAGNOSIS — J320 Chronic maxillary sinusitis: Secondary | ICD-10-CM

## 2016-02-19 DIAGNOSIS — J329 Chronic sinusitis, unspecified: Secondary | ICD-10-CM | POA: Insufficient documentation

## 2016-02-19 MED ORDER — AZITHROMYCIN 250 MG PO TABS: ORAL_TABLET | ORAL | 0 refills | Status: DC

## 2016-02-19 MED ORDER — PROMETHAZINE-CODEINE 6.25-10 MG/5ML PO SYRP: 5.0000 mL | ORAL_SOLUTION | ORAL | 0 refills | Status: DC | PRN

## 2016-02-19 NOTE — Progress Notes (Signed)
Subjective:  Patient ID: Kayla Tate, female    DOB: June 16, 1960  Age: 55 y.o. MRN: [id number]  CC: Cough (x 10 days)   HPI Kayla Tate presents for cough x 2 wks after a trip to [place]. Son was ill too. No chills. C/o nasal congestion x months - worse. C/o knee pain worse B  Outpatient Medications Prior to Visit  Medication Sig Dispense Refill  . aspirin EC 81 MG tablet Take 1 tablet (81 mg total) by mouth daily. 100 tablet 3  . Cholecalciferol 1000 UNITS tablet Take 1,000 Units by mouth daily. Reported on 05/23/2015    . ergocalciferol (VITAMIN D2) 50000 units capsule Take 1 capsule (50,000 Units total) by mouth once a week. 6 capsule 0   No facility-administered medications prior to visit.     ROS Review of Systems  Constitutional: Negative for activity change, appetite change, chills, fatigue and unexpected weight change.  HENT: Positive for congestion and rhinorrhea. Negative for mouth sores and sinus pressure.   Eyes: Negative for visual disturbance.  Respiratory: Positive for cough. Negative for chest tightness.   Gastrointestinal: Negative for abdominal pain and nausea.  Genitourinary: Negative for difficulty urinating, frequency and vaginal pain.  Musculoskeletal: Negative for back pain and gait problem.  Skin: Negative for pallor and rash.  Neurological: Negative for dizziness, tremors, weakness, numbness and headaches.  Psychiatric/Behavioral: Negative for confusion and sleep disturbance.    Objective:  BP 110/80   Pulse 72   Temp 98.2 F (36.8 C) (Oral)   Resp 14   Wt 141 lb (64 kg)   LMP 02/27/2014   BMI 24.20 kg/m   BP Readings from Last 3 Encounters:  02/19/16 110/80  05/23/15 130/74  04/05/14 98/63    Wt Readings from Last 3 Encounters:  02/19/16 141 lb (64 kg)  05/23/15 141 lb (64 kg)  04/05/14 140 lb (63.5 kg)    Physical Exam  Constitutional: She appears well-developed. No distress.  HENT:  Head: Normocephalic.  Right Ear:  External ear normal.  Left Ear: External ear normal.  Nose: Nose normal.  Mouth/Throat: Oropharynx is clear and moist.  Eyes: Conjunctivae are normal. Pupils are equal, round, and reactive to light. Right eye exhibits no discharge. Left eye exhibits no discharge.  Neck: Normal range of motion. Neck supple. No JVD present. No tracheal deviation present. No thyromegaly present.  Cardiovascular: Normal rate, regular rhythm and normal heart sounds.   Pulmonary/Chest: No stridor. No respiratory distress. She has no wheezes.  Abdominal: Soft. Bowel sounds are normal. She exhibits no distension and no mass. There is no tenderness. There is no rebound and no guarding.  Musculoskeletal: She exhibits no edema or tenderness.  Lymphadenopathy:    She has no cervical adenopathy.  Neurological: She displays normal reflexes. No cranial nerve deficit. She exhibits normal muscle tone. Coordination normal.  Skin: No rash noted. No erythema.  Psychiatric: She has a normal mood and affect. Her behavior is normal. Judgment and thought content normal.    Lab Results  Component Value Date   WBC 7.2 05/23/2015   HGB 14.1 05/23/2015   HCT 43.0 05/23/2015   PLT 193.0 05/23/2015   GLUCOSE 99 05/23/2015   CHOL 207 (H) 05/23/2015   TRIG 74.0 05/23/2015   HDL 63.50 05/23/2015   LDLDIRECT 140.4 12/06/2010   LDLCALC 129 (H) 05/23/2015   ALT 19 05/23/2015   AST 28 05/23/2015   NA 141 05/23/2015   K 4.6 05/23/2015   CL 104 05/23/2015  CREATININE 0.74 05/23/2015   BUN 9 05/23/2015   CO2 30 05/23/2015   TSH 1.63 05/23/2015    Dg Chest 2 View  Result Date: 05/24/2015 CLINICAL DATA:  Intermittent chest pain for several months, routine physical EXAM: CHEST  2 VIEW COMPARISON:  None. FINDINGS: The heart size and mediastinal contours are within normal limits. Both lungs are clear. The visualized skeletal structures are unremarkable. IMPRESSION: No active cardiopulmonary disease. Electronically Signed   By: Inez Catalina M.D.   On: 05/24/2015 08:05    Assessment & Plan:   There are no diagnoses linked to this encounter. I am having Ms. Dimeo maintain her Cholecalciferol, aspirin EC, and ergocalciferol.  No orders of the defined types were placed in this encounter.    Follow-up: No Follow-up on file.  Walker Kehr, MD

## 2016-02-19 NOTE — Assessment & Plan Note (Signed)
CT sinuses

## 2016-02-19 NOTE — Assessment & Plan Note (Signed)
Acute bronchitis 10/17 Zpac and CXR if worse Prom-cod

## 2016-02-19 NOTE — Progress Notes (Signed)
Pre visit review using our clinic review tool, if applicable. No additional management support is needed unless otherwise documented below in the visit note. 

## 2016-02-21 ENCOUNTER — Other Ambulatory Visit: Payer: Self-pay | Admitting: Internal Medicine

## 2016-02-21 DIAGNOSIS — J329 Chronic sinusitis, unspecified: Secondary | ICD-10-CM

## 2016-02-26 ENCOUNTER — Ambulatory Visit (INDEPENDENT_AMBULATORY_CARE_PROVIDER_SITE_OTHER)
Admission: RE | Admit: 2016-02-26 | Discharge: 2016-02-26 | Disposition: A | Discharge status: Home / Self Care | Payer: BLUE CROSS/BLUE SHIELD | Source: Ambulatory Visit | Attending: Internal Medicine | Admitting: Internal Medicine

## 2016-02-26 DIAGNOSIS — J329 Chronic sinusitis, unspecified: Secondary | ICD-10-CM | POA: Diagnosis not present

## 2016-02-26 DIAGNOSIS — J320 Chronic maxillary sinusitis: Secondary | ICD-10-CM

## 2016-09-01 ENCOUNTER — Encounter: Payer: Self-pay | Admitting: Internal Medicine

## 2016-09-01 ENCOUNTER — Other Ambulatory Visit (INDEPENDENT_AMBULATORY_CARE_PROVIDER_SITE_OTHER): Payer: BLUE CROSS/BLUE SHIELD

## 2016-09-01 ENCOUNTER — Ambulatory Visit (INDEPENDENT_AMBULATORY_CARE_PROVIDER_SITE_OTHER): Payer: BLUE CROSS/BLUE SHIELD | Admitting: Internal Medicine

## 2016-09-01 VITALS — BP 126/80 | HR 66 | Temp 98.5°F | Ht 64.0 in | Wt 140.0 lb

## 2016-09-01 DIAGNOSIS — M25562 Pain in left knee: Secondary | ICD-10-CM

## 2016-09-01 DIAGNOSIS — Z0000 Encounter for general adult medical examination without abnormal findings: Secondary | ICD-10-CM | POA: Diagnosis not present

## 2016-09-01 DIAGNOSIS — Z23 Encounter for immunization: Secondary | ICD-10-CM | POA: Diagnosis not present

## 2016-09-01 DIAGNOSIS — G8929 Other chronic pain: Secondary | ICD-10-CM | POA: Diagnosis not present

## 2016-09-01 LAB — LIPID PANEL
CHOLESTEROL: 189 mg/dL (ref 0–200)
HDL: 58 mg/dL (ref >39.00)
LDL Cholesterol: 119 mg/dL — ABNORMAL HIGH (ref 0–99)
NonHDL: 131.01
TRIGLYCERIDES: 58 mg/dL (ref 0.0–149.0)
Total CHOL/HDL Ratio: 3
VLDL: 11.6 mg/dL (ref 0.0–40.0)

## 2016-09-01 LAB — HEPATIC FUNCTION PANEL
ALT: 17 U/L (ref 0–35)
AST: 29 U/L (ref 0–37)
Albumin: 4.4 g/dL (ref 3.5–5.2)
Alkaline Phosphatase: 85 U/L (ref 39–117)
BILIRUBIN DIRECT: 0.1 mg/dL (ref 0.0–0.3)
BILIRUBIN TOTAL: 0.7 mg/dL (ref 0.2–1.2)
TOTAL PROTEIN: 7.3 g/dL (ref 6.0–8.3)

## 2016-09-01 LAB — URINALYSIS
Bilirubin Urine: NEGATIVE
HGB URINE DIPSTICK: NEGATIVE
Ketones, ur: NEGATIVE
LEUKOCYTES UA: NEGATIVE
NITRITE: NEGATIVE
Total Protein, Urine: NEGATIVE
URINE GLUCOSE: NEGATIVE
UROBILINOGEN UA: 0.2 (ref 0.0–1.0)
pH: 7 (ref 5.0–8.0)

## 2016-09-01 LAB — BASIC METABOLIC PANEL
BUN: 12 mg/dL (ref 6–23)
CALCIUM: 9.5 mg/dL (ref 8.4–10.5)
CO2: 29 mEq/L (ref 19–32)
CREATININE: 0.68 mg/dL (ref 0.40–1.20)
Chloride: 104 mEq/L (ref 96–112)
GFR: 95.7 mL/min (ref >60.00)
Glucose, Bld: 90 mg/dL (ref 70–99)
Potassium: 3.8 mEq/L (ref 3.5–5.1)
SODIUM: 139 meq/L (ref 135–145)

## 2016-09-01 LAB — TSH: TSH: 1.9 u[IU]/mL (ref 0.35–4.50)

## 2016-09-01 NOTE — Assessment & Plan Note (Signed)
We discussed age appropriate health related issues, including available/recomended screening tests and vaccinations. We discussed a need for adhering to healthy diet and exercise. Labs were ordered to be later reviewed . All questions were answered. Shingrix Colon q 5 years

## 2016-09-01 NOTE — Progress Notes (Signed)
Subjective:  Patient ID: Kayla Tate, female    DOB: 1960/06/11  Age: 56 y.o. MRN: [id number]  CC: No chief complaint on file.   HPI [name] presents for a well exam C/o L knee is worse (L knee is s/p remote reconstruction)  Outpatient Medications Prior to Visit  Medication Sig Dispense Refill  . aspirin EC 81 MG tablet Take 1 tablet (81 mg total) by mouth daily. 100 tablet 3  . azithromycin (ZITHROMAX) 250 MG tablet As directed 6 tablet 0  . Cholecalciferol 1000 UNITS tablet Take 1,000 Units by mouth daily. Reported on 05/23/2015    . ergocalciferol (VITAMIN D2) 50000 units capsule Take 1 capsule (50,000 Units total) by mouth once a week. 6 capsule 0  . promethazine-codeine (PHENERGAN WITH CODEINE) 6.25-10 MG/5ML syrup Take 5 mLs by mouth every 4 (four) hours as needed. 300 mL 0   No facility-administered medications prior to visit.     ROS Review of Systems  Constitutional: Negative for activity change, appetite change, chills, diaphoresis, fatigue, fever and unexpected weight change.  HENT: Negative for congestion, dental problem, ear pain, hearing loss, mouth sores, postnasal drip, sinus pressure, sneezing, sore throat and voice change.   Eyes: Negative for pain and visual disturbance.  Respiratory: Negative for cough, chest tightness, wheezing and stridor.   Cardiovascular: Negative for chest pain, palpitations and leg swelling.  Gastrointestinal: Negative for abdominal distention, abdominal pain, blood in stool, nausea, rectal pain and vomiting.  Genitourinary: Negative for decreased urine volume, difficulty urinating, dysuria, frequency, hematuria, menstrual problem, vaginal bleeding, vaginal discharge and vaginal pain.  Musculoskeletal: Positive for arthralgias and gait problem. Negative for back pain, joint swelling and neck pain.  Skin: Negative for color change, pallor, rash and wound.  Neurological: Negative for dizziness, tremors, syncope, speech  difficulty, weakness, light-headedness, numbness and headaches.  Hematological: Negative for adenopathy.  Psychiatric/Behavioral: Negative for behavioral problems, confusion, decreased concentration, dysphoric mood, hallucinations, sleep disturbance and suicidal ideas. The patient is not nervous/anxious and is not hyperactive.     Objective:  BP 126/80 (BP Location: Left Arm, Patient Position: Sitting, Cuff Size: Normal)   Pulse 66   Temp 98.5 F (36.9 C) (Oral)   Ht 5\' 4"  (1.626 m)   Wt 140 lb (63.5 kg)   LMP 02/27/2014   SpO2 100%   BMI 24.03 kg/m   BP Readings from Last 3 Encounters:  09/01/16 126/80  02/19/16 110/80  05/23/15 130/74    Wt Readings from Last 3 Encounters:  09/01/16 140 lb (63.5 kg)  02/19/16 141 lb (64 kg)  05/23/15 141 lb (64 kg)    Physical Exam  Constitutional: She appears well-developed. No distress.  HENT:  Head: Normocephalic.  Right Ear: External ear normal.  Left Ear: External ear normal.  Nose: Nose normal.  Mouth/Throat: Oropharynx is clear and moist.  Eyes: Conjunctivae are normal. Pupils are equal, round, and reactive to light. Right eye exhibits no discharge. Left eye exhibits no discharge.  Neck: Normal range of motion. Neck supple. No JVD present. No tracheal deviation present. No thyromegaly present.  Cardiovascular: Normal rate, regular rhythm and normal heart sounds.   Pulmonary/Chest: No stridor. No respiratory distress. She has no wheezes.  Abdominal: Soft. Bowel sounds are normal. She exhibits no distension and no mass. There is no tenderness. There is no rebound and no guarding.  Musculoskeletal: She exhibits tenderness. She exhibits no edema.  Lymphadenopathy:    She has no cervical adenopathy.  Neurological: She displays normal reflexes.  No cranial nerve deficit. She exhibits normal muscle tone. Coordination normal.  Skin: No rash noted. No erythema.  Psychiatric: She has a normal mood and affect. Her behavior is normal.  Judgment and thought content normal.  L knee w/pain and decreased ROM  Lab Results  Component Value Date   WBC 7.2 05/23/2015   HGB 14.1 05/23/2015   HCT 43.0 05/23/2015   PLT 193.0 05/23/2015   GLUCOSE 99 05/23/2015   CHOL 207 (H) 05/23/2015   TRIG 74.0 05/23/2015   HDL 63.50 05/23/2015   LDLDIRECT 140.4 12/06/2010   LDLCALC 129 (H) 05/23/2015   ALT 19 05/23/2015   AST 28 05/23/2015   NA 141 05/23/2015   K 4.6 05/23/2015   CL 104 05/23/2015   CREATININE 0.74 05/23/2015   BUN 9 05/23/2015   CO2 30 05/23/2015   TSH 1.63 05/23/2015    Ct Maxillofacial Ltd Wo Cm  Result Date: 02/27/2016 CLINICAL DATA:  Chronic sinusitis with headaches EXAM: CT PARANASAL SINUS LIMITED WITHOUT CONTRAST TECHNIQUE: Non-contiguous multidetector CT images of the paranasal sinuses were obtained in a single plane without contrast. COMPARISON:  None. FINDINGS: There is minimal mucosal thickening in the inferior maxillary sinuses bilaterally. The paranasal sinuses including the sphenoid, ethmoid, and frontal sinuses are identified without evidence of disease. No air-fluid levels are identified. There is leftward deviation of the nasal septum. The ostiomeatal complexes are patent bilaterally. The frontoethmoidal recesses are normal bilaterally. No bony abnormalities are identified. The visualized portions of the brain and orbits are unremarkable. IMPRESSION: 1. No evidence of sinusitis. Electronically Signed   By: Kathreen Devoid   On: 02/27/2016 08:32    Assessment & Plan:   There are no diagnoses linked to this encounter. I am having Ms. Montas maintain her Cholecalciferol, aspirin EC, ergocalciferol, azithromycin, and promethazine-codeine.  No orders of the defined types were placed in this encounter.    Follow-up: No Follow-up on file.  Walker Kehr, MD

## 2016-09-01 NOTE — Assessment & Plan Note (Signed)
Ortho ref offered NSAIDs, Tumeric prn

## 2016-09-01 NOTE — Addendum Note (Signed)
Addended by: Karren Cobble on: 09/01/2016 03:12 PM   Modules accepted: Orders

## 2016-09-01 NOTE — Patient Instructions (Signed)
Turmeric

## 2016-09-02 ENCOUNTER — Encounter: Payer: Self-pay | Admitting: Internal Medicine

## 2016-09-10 ENCOUNTER — Other Ambulatory Visit: Payer: Self-pay | Admitting: Obstetrics & Gynecology

## 2016-09-10 DIAGNOSIS — Z1231 Encounter for screening mammogram for malignant neoplasm of breast: Secondary | ICD-10-CM

## 2016-09-12 ENCOUNTER — Ambulatory Visit (INDEPENDENT_AMBULATORY_CARE_PROVIDER_SITE_OTHER): Payer: BLUE CROSS/BLUE SHIELD | Admitting: Obstetrics & Gynecology

## 2016-09-12 ENCOUNTER — Encounter: Payer: Self-pay | Admitting: Obstetrics & Gynecology

## 2016-09-12 VITALS — BP 128/86 | Ht 63.5 in | Wt 138.0 lb

## 2016-09-12 DIAGNOSIS — Z1151 Encounter for screening for human papillomavirus (HPV): Secondary | ICD-10-CM

## 2016-09-12 DIAGNOSIS — N813 Complete uterovaginal prolapse: Secondary | ICD-10-CM | POA: Diagnosis not present

## 2016-09-12 DIAGNOSIS — Z780 Asymptomatic menopausal state: Secondary | ICD-10-CM | POA: Diagnosis not present

## 2016-09-12 DIAGNOSIS — Z01411 Encounter for gynecological examination (general) (routine) with abnormal findings: Secondary | ICD-10-CM

## 2016-09-12 NOTE — Patient Instructions (Signed)
1. Encounter for gynecological examination with abnormal finding Gyn exam normal except for complete Uterine prolapse.  Pap/HPV done.  Screening mammo scheduled for June 2018.  2. Complete uterine prolapse Very mild Sxs.  Prefers to observe without pessary and declines surgery at this time.  3. Menopause present No HRT.  No PMB.  Well tolerated.   Kayla Tate, it was a pleasure to see you today!  I will inform you of the result as soon as available.

## 2016-09-12 NOTE — Addendum Note (Signed)
Addended by: Thurnell Garbe A on: 09/12/2016 03:47 PM   Modules accepted: Orders

## 2016-09-12 NOTE — Progress Notes (Signed)
Kayla Tate 1960-09-05 [id number]   History:    56 y.o.  G2P2 married.  Daughter 5 yo teaching [employer] in [place].  Son is 68 yo Teacher, adult education at [employer]  RP:  Established patient presenting for annual gyn exam  HPI:  Menopause.  No HRT.  No PMB.  No pelvic pain.  Stopped using the pessary for complete Uterine prolapse because it was making it difficult to pass BMs.  Sxs are mild and patient is active in spite of it.  Breasts wnl.  Past medical history,surgical history, family history and social history were all reviewed and documented in the EPIC chart.  Gynecologic History Patient's last menstrual period was 02/27/2014. Contraception: post menopausal status Last Pap: 2015. Results were: normal Last mammogram: 2015. Results were: normal  Obstetric History OB History  Gravida Para Term Preterm AB Living  2 2       2   SAB TAB Ectopic Multiple Live Births               # Outcome Date GA Lbr Len/2nd Weight Sex Delivery Anes PTL Lv  2 Para           1 Para                ROS: A ROS was performed and pertinent positives and negatives are included in the history.  GENERAL: No fevers or chills. HEENT: No change in vision, no earache, sore throat or sinus congestion. NECK: No pain or stiffness. CARDIOVASCULAR: No chest pain or pressure. No palpitations. PULMONARY: No shortness of breath, cough or wheeze. GASTROINTESTINAL: No abdominal pain, nausea, vomiting or diarrhea, melena or bright red blood per rectum. GENITOURINARY: No urinary frequency, urgency, hesitancy or dysuria. MUSCULOSKELETAL: No joint or muscle pain, no back pain, no recent trauma. DERMATOLOGIC: No rash, no itching, no lesions. ENDOCRINE: No polyuria, polydipsia, no heat or cold intolerance. No recent change in weight. HEMATOLOGICAL: No anemia or easy bruising or bleeding. NEUROLOGIC: No headache, seizures, numbness, tingling or weakness. PSYCHIATRIC: No depression, no loss of interest in normal  activity or change in sleep pattern.     Exam:   BP 128/86   Ht 5' 3.5" (1.613 m)   Wt 138 lb (62.6 kg)   LMP 02/27/2014   BMI 24.06 kg/m   Body mass index is 24.06 kg/m.  General appearance : Well developed well nourished female. No acute distress HEENT: Eyes: no retinal hemorrhage or exudates,  Neck supple, trachea midline, no carotid bruits, no thyroidmegaly Lungs: Clear to auscultation, no rhonchi or wheezes, or rib retractions  Heart: Regular rate and rhythm, no murmurs or gallops Breast:Examined in sitting and supine position were symmetrical in appearance, no palpable masses or tenderness,  no skin retraction, no nipple inversion, no nipple discharge, no skin discoloration, no axillary or supraclavicular lymphadenopathy Abdomen: no palpable masses or tenderness, no rebound or guarding Extremities: no edema or skin discoloration or tenderness  Pelvic:  Bartholin, Urethra, Skene Glands: Within normal limits             Vagina: No gross lesions or discharge  Cervix: No gross lesions or discharge.  Pap done.  Uterus:  Complete Uterine prolapse.  No ulceration or inflammation.  Normal uterine size, shape and consistency, non-tender and mobile.  Adnexa  Without masses or tenderness  Anus and perineum  normal   Assessment/Plan:  56 y.o. female for annual exam   1. Encounter for gynecological examination with abnormal finding Gyn exam normal except  for complete Uterine prolapse.  Pap/HPV done.  Screening mammo scheduled for June 2018.  2. Complete uterine prolapse Very mild Sxs.  Prefers to observe without pessary and declines surgery at this time.  3. Menopause present No HRT.  No PMB.  Well tolerated.  Princess Bruins MD, 3:19 PM 09/12/2016

## 2016-09-17 LAB — PAP, TP IMAGING W/ HPV RNA, RFLX HPV TYPE 16,18/45: HPV MRNA, HIGH RISK: NOT DETECTED

## 2016-09-29 ENCOUNTER — Ambulatory Visit
Admission: RE | Admit: 2016-09-29 | Discharge: 2016-09-29 | Disposition: A | Discharge status: Home / Self Care | Payer: BLUE CROSS/BLUE SHIELD | Source: Ambulatory Visit | Attending: Obstetrics & Gynecology | Admitting: Obstetrics & Gynecology

## 2016-09-29 DIAGNOSIS — Z1231 Encounter for screening mammogram for malignant neoplasm of breast: Secondary | ICD-10-CM | POA: Diagnosis not present

## 2016-11-11 ENCOUNTER — Telehealth: Payer: Self-pay | Admitting: Emergency Medicine

## 2016-11-11 NOTE — Telephone Encounter (Signed)
LVM for pt to call back and schedule nurse visit for 2nd Shingrix Vaccine.

## 2017-01-06 ENCOUNTER — Telehealth: Payer: Self-pay

## 2017-01-06 NOTE — Telephone Encounter (Signed)
Left 2nd message reminding that 2nd shingrix vaccine is due before mid November---patient needs to call and schedule nurse visit,---must have 2nd injection for the vaccine to be totally effective

## 2017-03-09 ENCOUNTER — Telehealth: Payer: Self-pay

## 2017-03-09 NOTE — Telephone Encounter (Signed)
Left message with patient's husband to call our office, also left voicemail on cell phone---patient needs to schedule 2nd shingrix vaccines ASAP---she recd first injection in may,2018---has to get 2nd injection now in order for vaccine to work appropriately---patient needs to schedule nurse visit or can talk with tamara if any further questions

## 2018-01-20 ENCOUNTER — Other Ambulatory Visit: Payer: Self-pay | Admitting: Obstetrics & Gynecology

## 2018-01-20 DIAGNOSIS — Z1231 Encounter for screening mammogram for malignant neoplasm of breast: Secondary | ICD-10-CM

## 2018-02-22 ENCOUNTER — Ambulatory Visit
Admission: RE | Admit: 2018-02-22 | Discharge: 2018-02-22 | Disposition: A | Discharge status: Home / Self Care | Payer: BLUE CROSS/BLUE SHIELD | Source: Ambulatory Visit | Attending: Obstetrics & Gynecology | Admitting: Obstetrics & Gynecology

## 2018-02-22 DIAGNOSIS — Z1231 Encounter for screening mammogram for malignant neoplasm of breast: Secondary | ICD-10-CM | POA: Diagnosis not present

## 2018-03-25 ENCOUNTER — Ambulatory Visit: Payer: BLUE CROSS/BLUE SHIELD | Admitting: Internal Medicine

## 2018-03-30 ENCOUNTER — Ambulatory Visit (INDEPENDENT_AMBULATORY_CARE_PROVIDER_SITE_OTHER): Payer: BLUE CROSS/BLUE SHIELD | Admitting: Internal Medicine

## 2018-03-30 ENCOUNTER — Encounter: Payer: Self-pay | Admitting: Internal Medicine

## 2018-03-30 ENCOUNTER — Other Ambulatory Visit (INDEPENDENT_AMBULATORY_CARE_PROVIDER_SITE_OTHER): Payer: BLUE CROSS/BLUE SHIELD

## 2018-03-30 VITALS — BP 124/74 | HR 63 | Temp 98.2°F | Ht 63.5 in | Wt 141.0 lb

## 2018-03-30 DIAGNOSIS — Z0000 Encounter for general adult medical examination without abnormal findings: Secondary | ICD-10-CM

## 2018-03-30 DIAGNOSIS — R252 Cramp and spasm: Secondary | ICD-10-CM | POA: Diagnosis not present

## 2018-03-30 DIAGNOSIS — Z7721 Contact with and (suspected) exposure to potentially hazardous body fluids: Secondary | ICD-10-CM

## 2018-03-30 DIAGNOSIS — D508 Other iron deficiency anemias: Secondary | ICD-10-CM

## 2018-03-30 DIAGNOSIS — Z23 Encounter for immunization: Secondary | ICD-10-CM

## 2018-03-30 LAB — VITAMIN B12: Vitamin B-12: 424 pg/mL (ref 211–911)

## 2018-03-30 LAB — CBC WITH DIFFERENTIAL/PLATELET
BASOS ABS: 0 10*3/uL (ref 0.0–0.1)
Basophils Relative: 0.8 % (ref 0.0–3.0)
Eosinophils Absolute: 0 10*3/uL (ref 0.0–0.7)
Eosinophils Relative: 1 % (ref 0.0–5.0)
HCT: 41.4 % (ref 36.0–46.0)
Hemoglobin: 13.7 g/dL (ref 12.0–15.0)
LYMPHS ABS: 2.7 10*3/uL (ref 0.7–4.0)
Lymphocytes Relative: 54.7 % — ABNORMAL HIGH (ref 12.0–46.0)
MCHC: 33 g/dL (ref 30.0–36.0)
MCV: 90.2 fl (ref 78.0–100.0)
Monocytes Absolute: 0.4 10*3/uL (ref 0.1–1.0)
Monocytes Relative: 8.3 % (ref 3.0–12.0)
Neutro Abs: 1.8 10*3/uL (ref 1.4–7.7)
Neutrophils Relative %: 35.2 % — ABNORMAL LOW (ref 43.0–77.0)
PLATELETS: 194 10*3/uL (ref 150.0–400.0)
RBC: 4.59 Mil/uL (ref 3.87–5.11)
RDW: 13.6 % (ref 11.5–15.5)
WBC: 5 10*3/uL (ref 4.0–10.5)

## 2018-03-30 LAB — BASIC METABOLIC PANEL
BUN: 11 mg/dL (ref 6–23)
CO2: 31 mEq/L (ref 19–32)
Calcium: 9.7 mg/dL (ref 8.4–10.5)
Chloride: 102 mEq/L (ref 96–112)
Creatinine, Ser: 0.7 mg/dL (ref 0.40–1.20)
GFR: 92.02 mL/min (ref >60.00)
Glucose, Bld: 93 mg/dL (ref 70–99)
POTASSIUM: 3.7 meq/L (ref 3.5–5.1)
Sodium: 139 mEq/L (ref 135–145)

## 2018-03-30 LAB — URINALYSIS
Bilirubin Urine: NEGATIVE
HGB URINE DIPSTICK: NEGATIVE
Ketones, ur: NEGATIVE
Leukocytes, UA: NEGATIVE
Nitrite: NEGATIVE
Specific Gravity, Urine: <=1.005 — AB (ref 1.000–1.030)
Total Protein, Urine: NEGATIVE
URINE GLUCOSE: NEGATIVE
Urobilinogen, UA: 0.2 (ref 0.0–1.0)
pH: 7 (ref 5.0–8.0)

## 2018-03-30 LAB — HEPATIC FUNCTION PANEL
ALT: 20 U/L (ref 0–35)
AST: 26 U/L (ref 0–37)
Albumin: 4.4 g/dL (ref 3.5–5.2)
Alkaline Phosphatase: 84 U/L (ref 39–117)
Bilirubin, Direct: 0.1 mg/dL (ref 0.0–0.3)
Total Bilirubin: 0.6 mg/dL (ref 0.2–1.2)
Total Protein: 7.4 g/dL (ref 6.0–8.3)

## 2018-03-30 LAB — LIPID PANEL
CHOL/HDL RATIO: 3
Cholesterol: 220 mg/dL — ABNORMAL HIGH (ref 0–200)
HDL: 66 mg/dL (ref >39.00)
LDL Cholesterol: 141 mg/dL — ABNORMAL HIGH (ref 0–99)
NonHDL: 154.48
Triglycerides: 66 mg/dL (ref 0.0–149.0)
VLDL: 13.2 mg/dL (ref 0.0–40.0)

## 2018-03-30 LAB — CK: CK TOTAL: 93 U/L (ref 7–177)

## 2018-03-30 LAB — VITAMIN D 25 HYDROXY (VIT D DEFICIENCY, FRACTURES): VITD: 35.58 ng/mL (ref 30.00–100.00)

## 2018-03-30 LAB — TSH: TSH: 2.91 u[IU]/mL (ref 0.35–4.50)

## 2018-03-30 NOTE — Progress Notes (Signed)
Subjective:  Patient ID: Kayla Tate, female    DOB: [birth year]  Age: 57 y.o. MRN: [id number]  CC: No chief complaint on file.   HPI Kayla Tate presents for a well exam C/o R>L cramps C/o occ hand numbness  Outpatient Medications Prior to Visit  Medication Sig Dispense Refill  . Cholecalciferol 1000 UNITS tablet Take 1,000 Units by mouth daily. Reported on 05/23/2015    . aspirin EC 81 MG tablet Take 1 tablet (81 mg total) by mouth daily. 100 tablet 3   No facility-administered medications prior to visit.     ROS: Review of Systems  Constitutional: Negative for activity change, appetite change, chills, fatigue and unexpected weight change.  HENT: Negative for congestion, mouth sores and sinus pressure.   Eyes: Negative for visual disturbance.  Respiratory: Negative for cough and chest tightness.   Gastrointestinal: Negative for abdominal pain and nausea.  Genitourinary: Negative for difficulty urinating, frequency and vaginal pain.  Musculoskeletal: Positive for myalgias. Negative for back pain and gait problem.  Skin: Negative for pallor and rash.  Neurological: Negative for dizziness, tremors, weakness, numbness and headaches.  Psychiatric/Behavioral: Negative for confusion and sleep disturbance.    Objective:  BP 124/74 (BP Location: Left Arm, Patient Position: Sitting, Cuff Size: Normal)   Pulse 63   Temp 98.2 F (36.8 C) (Oral)   Ht 5' 3.5" (1.613 m)   Wt 141 lb (64 kg)   LMP 02/27/2014   SpO2 97%   BMI 24.59 kg/m   BP Readings from Last 3 Encounters:  03/30/18 124/74  09/12/16 128/86  09/01/16 126/80    Wt Readings from Last 3 Encounters:  03/30/18 141 lb (64 kg)  09/12/16 138 lb (62.6 kg)  09/01/16 140 lb (63.5 kg)    Physical Exam  Constitutional: She appears well-developed. No distress.  HENT:  Head: Normocephalic.  Right Ear: External ear normal.  Left Ear: External ear normal.  Nose: Nose normal.  Mouth/Throat: Oropharynx is clear  and moist.  Eyes: Pupils are equal, round, and reactive to light. Conjunctivae are normal. Right eye exhibits no discharge. Left eye exhibits no discharge.  Neck: Normal range of motion. Neck supple. No JVD present. No tracheal deviation present. No thyromegaly present.  Cardiovascular: Normal rate, regular rhythm and normal heart sounds.  Pulmonary/Chest: No stridor. No respiratory distress. She has no wheezes.  Abdominal: Soft. Bowel sounds are normal. She exhibits no distension and no mass. There is no tenderness. There is no rebound and no guarding.  Musculoskeletal: She exhibits no edema or tenderness.  Lymphadenopathy:    She has no cervical adenopathy.  Neurological: She displays normal reflexes. No cranial nerve deficit. She exhibits normal muscle tone. Coordination normal.  Skin: No rash noted. No erythema.  Psychiatric: She has a normal mood and affect. Her behavior is normal. Judgment and thought content normal.    Lab Results  Component Value Date   WBC 7.2 05/23/2015   HGB 14.1 05/23/2015   HCT 43.0 05/23/2015   PLT 193.0 05/23/2015   GLUCOSE 90 09/01/2016   CHOL 189 09/01/2016   TRIG 58.0 09/01/2016   HDL 58.00 09/01/2016   LDLDIRECT 140.4 12/06/2010   LDLCALC 119 (H) 09/01/2016   ALT 17 09/01/2016   AST 29 09/01/2016   NA 139 09/01/2016   K 3.8 09/01/2016   CL 104 09/01/2016   CREATININE 0.68 09/01/2016   BUN 12 09/01/2016   CO2 29 09/01/2016   TSH 1.90 09/01/2016    Mm 3d Screen  Breast Bilateral  Result Date: 02/23/2018 CLINICAL DATA:  Screening. EXAM: DIGITAL SCREENING BILATERAL MAMMOGRAM WITH TOMO AND CAD COMPARISON:  Previous exam(s). ACR Breast Density Category b: There are scattered areas of fibroglandular density. FINDINGS: There are no findings suspicious for malignancy. Images were processed with CAD. IMPRESSION: No mammographic evidence of malignancy. A result letter of this screening mammogram will be mailed directly to the patient. RECOMMENDATION:  Screening mammogram in one year. (Code:SM-B-01Y) BI-RADS CATEGORY  1: Negative. Electronically Signed   By: Nolon Nations M.D.   On: 02/23/2018 16:36    Assessment & Plan:   There are no diagnoses linked to this encounter.   No orders of the defined types were placed in this encounter.    Follow-up: No follow-ups on file.  Walker Kehr, MD

## 2018-03-30 NOTE — Assessment & Plan Note (Signed)
Labs

## 2018-03-30 NOTE — Assessment & Plan Note (Addendum)
Tylenol pm Magnesium Labs

## 2018-03-30 NOTE — Patient Instructions (Addendum)
Cardiac CT calcium scoring test $150   Computed tomography, more commonly known as a CT or CAT scan, is a diagnostic medical imaging test. Like traditional x-rays, it produces multiple images or pictures of the inside of the body. The cross-sectional images generated during a CT scan can be reformatted in multiple planes. They can even generate three-dimensional images. These images can be viewed on a computer monitor, printed on film or by a 3D printer, or transferred to a CD or DVD. CT images of internal organs, bones, soft tissue and blood vessels provide greater detail than traditional x-rays, particularly of soft tissues and blood vessels. A cardiac CT scan for coronary calcium is a non-invasive way of obtaining information about the presence, location and extent of calcified plaque in the coronary arteries-the vessels that supply oxygen-containing blood to the heart muscle. Calcified plaque results when there is a build-up of fat and other substances under the inner layer of the artery. This material can calcify which signals the presence of atherosclerosis, a disease of the vessel wall, also called coronary artery disease (CAD). People with this disease have an increased risk for heart attacks. In addition, over time, progression of plaque build up (CAD) can narrow the arteries or even close off blood flow to the heart. The result may be chest pain, sometimes called "angina," or a heart attack. Because calcium is a marker of CAD, the amount of calcium detected on a cardiac CT scan is a helpful prognostic tool. The findings on cardiac CT are expressed as a calcium score. Another name for this test is coronary artery calcium scoring.  What are some common uses of the procedure? The goal of cardiac CT scan for calcium scoring is to determine if CAD is present and to what extent, even if there are no symptoms. It is a screening study that may be recommended by a physician for patients with risk factors  for CAD but no clinical symptoms. The major risk factors for CAD are: . high blood cholesterol levels  . family history of heart attacks  . diabetes  . high blood pressure  . cigarette smoking  . overweight or obese  . physical inactivity   A negative cardiac CT scan for calcium scoring shows no calcification within the coronary arteries. This suggests that CAD is absent or so minimal it cannot be seen by this technique. The chance of having a heart attack over the next two to five years is very low under these circumstances. A positive test means that CAD is present, regardless of whether or not the patient is experiencing any symptoms. The amount of calcification-expressed as the calcium score-may help to predict the likelihood of a myocardial infarction (heart attack) in the coming years and helps your medical doctor or cardiologist decide whether the patient may need to take preventive medicine or undertake other measures such as diet and exercise to lower the risk for heart attack. The extent of CAD is graded according to your calcium score:  Calcium Score  Presence of CAD  0 No evidence of CAD   1-10 Minimal evidence of CAD  11-100 Mild evidence of CAD  101-400 Moderate evidence of CAD  Over 400 Extensive evidence of CAD    Tylenol PM Magnesium

## 2018-03-30 NOTE — Assessment & Plan Note (Addendum)
We discussed age appropriate health related issues, including available/recomended screening tests and vaccinations. We discussed a need for adhering to healthy diet and exercise. Labs were ordered to be later reviewed . All questions were answered. Colon 2015, due in 2020 Opth, GYN, mammo - q 1 year Shingrix 2018 Declined other shots

## 2018-03-31 LAB — IRON,TIBC AND FERRITIN PANEL
%SAT: 26 % (calc) (ref 16–45)
Ferritin: 34 ng/mL (ref 16–232)
Iron: 106 ug/dL (ref 45–160)
TIBC: 401 ug/dL (ref 250–450)

## 2018-03-31 LAB — HEPATITIS C ANTIBODY
HEP C AB: NONREACTIVE
SIGNAL TO CUT-OFF: 0.02 (ref <1.00)

## 2018-04-01 ENCOUNTER — Other Ambulatory Visit (INDEPENDENT_AMBULATORY_CARE_PROVIDER_SITE_OTHER): Payer: BLUE CROSS/BLUE SHIELD

## 2018-04-01 DIAGNOSIS — R252 Cramp and spasm: Secondary | ICD-10-CM | POA: Diagnosis not present

## 2018-04-01 LAB — MAGNESIUM: Magnesium: 2 mg/dL (ref 1.5–2.5)

## 2018-04-05 ENCOUNTER — Ambulatory Visit: Payer: BLUE CROSS/BLUE SHIELD | Admitting: Internal Medicine

## 2018-06-15 ENCOUNTER — Encounter: Payer: BLUE CROSS/BLUE SHIELD | Admitting: Obstetrics & Gynecology

## 2018-07-07 ENCOUNTER — Other Ambulatory Visit: Payer: Self-pay

## 2018-07-07 ENCOUNTER — Ambulatory Visit (INDEPENDENT_AMBULATORY_CARE_PROVIDER_SITE_OTHER): Payer: BLUE CROSS/BLUE SHIELD | Admitting: Obstetrics & Gynecology

## 2018-07-07 ENCOUNTER — Encounter: Payer: Self-pay | Admitting: Obstetrics & Gynecology

## 2018-07-07 VITALS — BP 122/84 | Ht 64.0 in | Wt 142.0 lb

## 2018-07-07 DIAGNOSIS — N813 Complete uterovaginal prolapse: Secondary | ICD-10-CM | POA: Diagnosis not present

## 2018-07-07 DIAGNOSIS — Z780 Asymptomatic menopausal state: Secondary | ICD-10-CM

## 2018-07-07 DIAGNOSIS — Z01419 Encounter for gynecological examination (general) (routine) without abnormal findings: Secondary | ICD-10-CM | POA: Diagnosis not present

## 2018-07-07 NOTE — Progress Notes (Signed)
Kayla Tate [birth year] [id number]   History:    58 y.o. G2P2L2 Married.  Daughter back from [place] 2 weeks ago, asymptomatic.  RP:  Established patient presenting for annual gyn exam   HPI: Menopause, well on no hormone replacement therapy.  No postmenopausal bleeding.  No pelvic pain.  Worsening discomfort and irritation associated with progression of complete uterine prolapse, not protruding more and more at the vulva most of the time, ready to proceed with surgery.  Had a pessary, but not using it because it was interfering with her bowel movements.  Minor stress urinary incontinence.  Bowel movements normal currently.  Breast normal.  Body mass index 24.37.  Good fitness.  Healthy nutrition.  Health labs with family physician.  Will be due for repeat screening colonoscopy in December 2020.  Past medical history,surgical history, family history and social history were all reviewed and documented in the EPIC chart.  Gynecologic History Patient's last menstrual period was 02/27/2014. Contraception: post menopausal status Last Pap: 08/2016. Results were: Negative/HPV HR neg Last mammogram: 02/2018. Results were: Negative Bone Density: Never Colonoscopy: 03/2014 Benign polyps.  Schedule Colono 03/2019.  Obstetric History OB History  Gravida Para Term Preterm AB Living  2 2       2   SAB TAB Ectopic Multiple Live Births               # Outcome Date GA Lbr Len/2nd Weight Sex Delivery Anes PTL Lv  2 Para           1 Para              ROS: A ROS was performed and pertinent positives and negatives are included in the history.  GENERAL: No fevers or chills. HEENT: No change in vision, no earache, sore throat or sinus congestion. NECK: No pain or stiffness. CARDIOVASCULAR: No chest pain or pressure. No palpitations. PULMONARY: No shortness of breath, cough or wheeze. GASTROINTESTINAL: No abdominal pain, nausea, vomiting or diarrhea, melena or bright red blood per rectum.  GENITOURINARY: No urinary frequency, urgency, hesitancy or dysuria. MUSCULOSKELETAL: No joint or muscle pain, no back pain, no recent trauma. DERMATOLOGIC: No rash, no itching, no lesions. ENDOCRINE: No polyuria, polydipsia, no heat or cold intolerance. No recent change in weight. HEMATOLOGICAL: No anemia or easy bruising or bleeding. NEUROLOGIC: No headache, seizures, numbness, tingling or weakness. PSYCHIATRIC: No depression, no loss of interest in normal activity or change in sleep pattern.     Exam:   BP 122/84   Ht 5\' 4"  (1.626 m)   Wt 142 lb (64.4 kg)   LMP 02/27/2014   BMI 24.37 kg/m   Body mass index is 24.37 kg/m.  General appearance : Well developed well nourished female. No acute distress HEENT: Eyes: no retinal hemorrhage or exudates,  Neck supple, trachea midline, no carotid bruits, no thyroidmegaly Lungs: Clear to auscultation, no rhonchi or wheezes, or rib retractions  Heart: Regular rate and rhythm, no murmurs or gallops Breast:Examined in sitting and supine position were symmetrical in appearance, no palpable masses or tenderness,  no skin retraction, no nipple inversion, no nipple discharge, no skin discoloration, no axillary or supraclavicular lymphadenopathy Abdomen: no palpable masses or tenderness, no rebound or guarding Extremities: no edema or skin discoloration or tenderness  Pelvic: Vulva: Normal             Vagina: No gross lesions or discharge  Cervix: No gross lesions or discharge.  Complete Uterine Prolapse with cervix and upper  vagina completely exteriorized even in decubitus dorsal position without valsalva.  Uterus Midline, normal size, shape and consistency, non-tender and mobile  Adnexa  Without masses or tenderness  Anus: Normal   Assessment/Plan:  58 y.o. female for annual exam   1. Well female exam with routine gynecological exam Gynecologic exam with complete uterine prolapse/eversion.  Last Pap test May 2018 was negative with negative  high-risk HPV, no indication to repeat a Pap test today.  Breast exam normal.  Screening mammogram November 2019 was negative.  Health labs with family physician.  Good body mass index at 24.37.  Continue with fitness and healthy nutrition.  2. Postmenopausal Well on no hormone replacement therapy.  No postmenopausal bleeding.  Vitamin D supplements, calcium intake of 1.2 to 1.5 g/day and regular weightbearing physical activities recommended.  3. Complete uterine prolapse Worsening more and more symptomatic complete uterine prolapse with eversion.  Patient desires proceeding with surgery at this point.  Robotic supracervical hysterectomy with BSO and sacrocolpopexy discussed with patient.  Pamphlet given.  Follow-up preop May 2020 given the current coronavirus crisis.  Will use pessary as needed in the meantime, in general patient prefers not to use it.  Other orders - calcium-vitamin D (OSCAL WITH D) 500-200 MG-UNIT tablet; Take 1 tablet by mouth.  Counseling on above issues and coordination of care more than 50% for 10 minutes.  Princess Bruins MD, 2:14 PM 07/07/2018

## 2018-07-11 ENCOUNTER — Encounter: Payer: Self-pay | Admitting: Obstetrics & Gynecology

## 2018-07-11 NOTE — Patient Instructions (Signed)
1. Well female exam with routine gynecological exam Gynecologic exam with complete uterine prolapse/eversion.  Last Pap test May 2018 was negative with negative high-risk HPV, no indication to repeat a Pap test today.  Breast exam normal.  Screening mammogram November 2019 was negative.  Health labs with family physician.  Good body mass index at 24.37.  Continue with fitness and healthy nutrition.  2. Postmenopausal Well on no hormone replacement therapy.  No postmenopausal bleeding.  Vitamin D supplements, calcium intake of 1.2 to 1.5 g/day and regular weightbearing physical activities recommended.  3. Complete uterine prolapse Worsening more and more symptomatic complete uterine prolapse with eversion.  Patient desires proceeding with surgery at this point.  Robotic supracervical hysterectomy with BSO and sacrocolpopexy discussed with patient.  Pamphlet given.  Follow-up preop May 2020 given the current coronavirus crisis.  Will use pessary as needed in the meantime, in general patient prefers not to use it.  Other orders - calcium-vitamin D (OSCAL WITH D) 500-200 MG-UNIT tablet; Take 1 tablet by mouth.  Joyce, it was a pleasure seeing you today!

## 2018-07-16 ENCOUNTER — Encounter: Payer: BLUE CROSS/BLUE SHIELD | Admitting: Obstetrics & Gynecology

## 2019-01-19 ENCOUNTER — Telehealth: Payer: Self-pay

## 2019-01-19 NOTE — Telephone Encounter (Signed)
I called patient and spoke with her. I reviewed her insurance benefits and her estimated surgery prepymt with her.  We discussed dates available and the need for Covid screen and quarantine from time of testing 4 days prior until surgery. Patient said she thinks she wants to wait until March 2021 and asked me to call her when I get that schedule to arrange surgery. I will do that.

## 2019-01-19 NOTE — Telephone Encounter (Signed)
Did not need this encounter °

## 2019-01-19 NOTE — Telephone Encounter (Signed)
Kayla Tate me that patient had left voice mail in her phone and asked me to call patient. I called the preferred home number and left message asking her to call me and my direct ph number.

## 2019-03-08 ENCOUNTER — Encounter: Payer: Self-pay | Admitting: Gastroenterology

## 2019-06-07 ENCOUNTER — Telehealth: Payer: Self-pay

## 2019-06-07 NOTE — Telephone Encounter (Signed)
Spoke with patient and let her know April schedule is available.  Scheduled her for 4/27 at 7:30am at [facility].  Pre op appt was added to her already scheduled CE on 07/08/19.  I had previously reviewed her ins benefits with her back in Sept and I told her I will be checking benefits again and as long as benefits did not change with the new year then the estimated pre pymt amount due by week before surgery will not change. She knows I will mail financial letter.  Covid test was scheduled for her (No Covid in last 90 days) and quarantine protocol was reviewed.   Packet will be mailed.

## 2019-06-21 ENCOUNTER — Encounter: Payer: Self-pay | Admitting: Anesthesiology

## 2019-07-07 ENCOUNTER — Other Ambulatory Visit: Payer: Self-pay

## 2019-07-07 ENCOUNTER — Other Ambulatory Visit: Payer: Self-pay | Admitting: Obstetrics & Gynecology

## 2019-07-07 DIAGNOSIS — Z1231 Encounter for screening mammogram for malignant neoplasm of breast: Secondary | ICD-10-CM

## 2019-07-08 ENCOUNTER — Encounter: Payer: BLUE CROSS/BLUE SHIELD | Admitting: Obstetrics & Gynecology

## 2019-07-08 ENCOUNTER — Telehealth: Payer: Self-pay

## 2019-07-08 ENCOUNTER — Ambulatory Visit: Payer: BC Managed Care – PPO | Admitting: Obstetrics & Gynecology

## 2019-07-08 ENCOUNTER — Encounter: Payer: Self-pay | Admitting: Obstetrics & Gynecology

## 2019-07-08 VITALS — BP 124/70 | Ht 63.75 in | Wt 140.6 lb

## 2019-07-08 DIAGNOSIS — Z01419 Encounter for gynecological examination (general) (routine) without abnormal findings: Secondary | ICD-10-CM | POA: Diagnosis not present

## 2019-07-08 DIAGNOSIS — N813 Complete uterovaginal prolapse: Secondary | ICD-10-CM | POA: Diagnosis not present

## 2019-07-08 DIAGNOSIS — Z780 Asymptomatic menopausal state: Secondary | ICD-10-CM | POA: Diagnosis not present

## 2019-07-08 DIAGNOSIS — Z01411 Encounter for gynecological examination (general) (routine) with abnormal findings: Secondary | ICD-10-CM

## 2019-07-08 NOTE — Progress Notes (Signed)
Kayla Tate Dec 27, 1960 [id number]   History:    59 y.o. G2P2L2 Married.  Daughter in Automotive engineer.  RP:  Established patient presenting for annual gyn exam   HPI: Menopause, well on no hormone replacement therapy.  No postmenopausal bleeding.  No pelvic pain.  Worsening discomfort and irritation associated with progression of complete uterine prolapse, now protruding more and more at the vulva most of the time, ready to proceed with surgery, scheduled for 08/16/2019.  Had a pessary, but not using it because it was interfering with her bowel movements.  Minor stress urinary incontinence.  Bowel movements normal currently.  Breast normal.  Body mass index 24.32.  Good fitness.  Healthy nutrition.  Health labs with family physician.  Colono to schedule.    Past medical history,surgical history, family history and social history were all reviewed and documented in the EPIC chart.  Gynecologic History Patient's last menstrual period was 02/27/2014.  Obstetric History OB History  Gravida Para Term Preterm AB Living  2 2       2   SAB TAB Ectopic Multiple Live Births               # Outcome Date GA Lbr Len/2nd Weight Sex Delivery Anes PTL Lv  2 Para           1 Para              ROS: A ROS was performed and pertinent positives and negatives are included in the history.  GENERAL: No fevers or chills. HEENT: No change in vision, no earache, sore throat or sinus congestion. NECK: No pain or stiffness. CARDIOVASCULAR: No chest pain or pressure. No palpitations. PULMONARY: No shortness of breath, cough or wheeze. GASTROINTESTINAL: No abdominal pain, nausea, vomiting or diarrhea, melena or bright red blood per rectum. GENITOURINARY: No urinary frequency, urgency, hesitancy or dysuria. MUSCULOSKELETAL: No joint or muscle pain, no back pain, no recent trauma. DERMATOLOGIC: No rash, no itching, no lesions. ENDOCRINE: No polyuria, polydipsia, no heat or cold intolerance. No  recent change in weight. HEMATOLOGICAL: No anemia or easy bruising or bleeding. NEUROLOGIC: No headache, seizures, numbness, tingling or weakness. PSYCHIATRIC: No depression, no loss of interest in normal activity or change in sleep pattern.     Exam:   BP 124/70   Ht 5' 3.75" (1.619 m)   Wt 140 lb 9.6 oz (63.8 kg)   LMP 02/27/2014   BMI 24.32 kg/m   Body mass index is 24.32 kg/m.  General appearance : Well developed well nourished female. No acute distress HEENT: Eyes: no retinal hemorrhage or exudates,  Neck supple, trachea midline, no carotid bruits, no thyroidmegaly Lungs: Clear to auscultation, no rhonchi or wheezes, or rib retractions  Heart: Regular rate and rhythm, no murmurs or gallops Breast:Examined in sitting and supine position were symmetrical in appearance, no palpable masses or tenderness,  no skin retraction, no nipple inversion, no nipple discharge, no skin discoloration, no axillary or supraclavicular lymphadenopathy Abdomen: no palpable masses or tenderness, no rebound or guarding Extremities: no edema or skin discoloration or tenderness  Pelvic: Vulva: Normal             Vagina: No gross lesions or discharge.  Cervix: No gross lesions or discharge.  Cervix completely protruding at the vulva.  Dry, but no ulceration.  Pap reflex done.  Uterus Complete prolapse, normal size, shape and consistency, non-tender and mobile.    Adnexa  Without masses or tenderness  Anus: Normal  Assessment/Plan:  59 y.o. female for annual exam   1. Encounter for routine gynecological examination with Papanicolaou smear of cervix Gynecologic exam with complete uterine prolapse.  Pap reflex done.  Breast exam normal.  Will schedule a screening mammogram now.  Colonoscopy to schedule this year.  Health labs with family physician.  Good body mass index at 24.32.  Continue with fitness and healthy nutrition.  2. Postmenopausal Well on no hormone replacement therapy.  No postmenopausal  bleeding.  Vitamin D supplements, calcium intake of 1.2 g/day and regular weightbearing physical activities to continue.  3. Complete uterine prolapse Symptomatic complete uterine prolapse with cervix protruding completely at the vulva.  Has used the pessary but given that she is very active, decision made to proceed with surgical correction.  XI robotic supracervical hysterectomy with BSO and sacrocolpopexy scheduled on August 16, 2019.  Preop preparation, surgical procedure with risks and benefits thoroughly reviewed, as well as postop precautions and expectations.  Patient voiced understanding and agreement with plan.                        Patient was counseled as to the risk of surgery to include the following:  1. Infection (prohylactic antibiotics will be administered)  2. DVT/Pulmonary Embolism (prophylactic pneumo compression stockings will be used)  3.Trauma to internal organs requiring additional surgical procedure to repair any injury to internal organs requiring perhaps additional hospitalization days.  4.Hemmorhage requiring transfusion and blood products which carry risks such as anaphylactic reaction, hepatitis and AIDS  Patient had received literature information on the procedure scheduled and all her questions were answered and fully accepts all risk.   Princess Bruins MD, 2:08 PM 07/08/2019

## 2019-07-08 NOTE — Telephone Encounter (Signed)
Scheduled for surgery on 4/27 and got called for jury duty and is asking for note to excuse her. I called her back to get her Juror  #  To include on letter but her summons is at home. She will call back Monday and give me her # and the address to send the letter to.

## 2019-07-09 NOTE — Patient Instructions (Signed)
1. Encounter for routine gynecological examination with Papanicolaou smear of cervix Gynecologic exam with complete uterine prolapse.  Pap reflex done.  Breast exam normal.  Will schedule a screening mammogram now.  Colonoscopy to schedule this year.  Health labs with family physician.  Good body mass index at 24.32.  Continue with fitness and healthy nutrition.  2. Postmenopausal Well on no hormone replacement therapy.  No postmenopausal bleeding.  Vitamin D supplements, calcium intake of 1.2 g/day and regular weightbearing physical activities to continue.  3. Complete uterine prolapse Symptomatic complete uterine prolapse with cervix protruding completely at the vulva.  Has used the pessary but given that she is very active, decision made to proceed with surgical correction.  XI robotic supracervical hysterectomy with BSO and sacrocolpopexy scheduled on August 16, 2019.  Preop preparation, surgical procedure with risks and benefits thoroughly reviewed, as well as postop precautions and expectations.  Patient voiced understanding and agreement with plan.                        Patient was counseled as to the risk of surgery to include the following:  1. Infection (prohylactic antibiotics will be administered)  2. DVT/Pulmonary Embolism (prophylactic pneumo compression stockings will be used)  3.Trauma to internal organs requiring additional surgical procedure to repair any injury to internal organs requiring perhaps additional hospitalization days.  4.Hemmorhage requiring transfusion and blood products which carry risks such as anaphylactic reaction, hepatitis and AIDS  Patient had received literature information on the procedure scheduled and all her questions were answered and fully accepts all risk.  Kayla Tate, it was a pleasure seeing you today!  Tate will inform you of your results as soon as they are available.

## 2019-07-12 LAB — PAP IG W/ RFLX HPV ASCU

## 2019-07-13 ENCOUNTER — Encounter: Payer: Self-pay | Admitting: *Deleted

## 2019-07-13 NOTE — Telephone Encounter (Signed)
Patient called back with all the jury summons information and letter was sent for her to be excused. Copy is in chart.

## 2019-07-26 ENCOUNTER — Ambulatory Visit
Admission: RE | Admit: 2019-07-26 | Discharge: 2019-07-26 | Disposition: A | Discharge status: Home / Self Care | Payer: BC Managed Care – PPO | Source: Ambulatory Visit | Attending: Obstetrics & Gynecology | Admitting: Obstetrics & Gynecology

## 2019-07-26 ENCOUNTER — Other Ambulatory Visit: Payer: Self-pay

## 2019-07-26 DIAGNOSIS — Z1231 Encounter for screening mammogram for malignant neoplasm of breast: Secondary | ICD-10-CM | POA: Diagnosis not present

## 2019-08-04 NOTE — Patient Instructions (Signed)
Get your covid test at [facility] [facility] on 4/23 at 11:20 AM   Kayla Tate       Your procedure is scheduled on 08/16/19   Report to [facility].   Call this number if you have problems the morning of surgery:[phone]   OUR ADDRESS IS [address], WE ARE LOCATED IN THE [facility]ICAL PLA[facility] WITH [facility].   Remember:  Do not eat food or drink liquids after midnight.  Take these medicines the morning of surgery with A SIP OF WATER: none   Do not wear jewelry, make-up or nail polish.  Do not wear lotions, powders, or perfumes, or deoderant.  Do not shave 48 hours prior to surgery.    Do not bring valuables to the hospital.  [facility] is not responsible for any belongings or valuables.  Contacts, dentures or bridgework may not be worn into surgery.  Leave your suitcase in the car.  After surgery it may be brought to your room.  For patients admitted to the hospital, discharge time will be determined by your treatment team.  Patients discharged the day of surgery will not be allowed to drive home.   Special instructions:   Please read over the following fact sheets that you were given:       [facility] - Preparing for Surgery Before surgery, you can play an important role.   Because skin is not sterile, your skin needs to be as free of germs as possible.   You can reduce the number of germs on your skin by washing with CHG (chlorahexidine gluconate) soap before surgery.   CHG is an antiseptic cleaner which kills germs and bonds with the skin to continue killing germs even after washing. Please DO NOT use if you have an allergy to CHG or antibacterial soaps.   If your skin becomes reddened/irritated stop using the CHG and inform your nurse when you arrive at Short Stay. Do not shave (including legs and underarms) for at least 48 hours prior to the first CHG shower.    Please follow these instructions  carefully:  1.  Shower with CHG Soap the night before surgery and the  morning of Surgery.  2.  If you choose to wash your hair, wash your hair first as usual with your  normal  shampoo.  3.  After you shampoo, rinse your hair and body thoroughly to remove the  shampoo.                                        4.  Use CHG as you would any other liquid soap.  You can apply chg directly  to the skin and wash                       Gently with a scrungie or clean washcloth.  5.  Apply the CHG Soap to your body ONLY FROM THE NECK DOWN.   Do not use on face/ open                           Wound or open sores. Avoid contact with eyes, ears mouth and genitals (private parts).  Wash face,  Genitals (private parts) with your normal soap.             6.  Wash thoroughly, paying special attention to the area where your surgery  will be performed.  7.  Thoroughly rinse your body with warm water from the neck down.  8.  DO NOT shower/wash with your normal soap after using and rinsing off  the CHG Soap.             9.  Pat yourself dry with a clean towel.            10.  Wear clean pajamas.            11.  Place clean sheets on your bed the night of your first shower and do not  sleep with pets. Day of Surgery : Do not apply any lotions/deodorants the morning of surgery.  Please wear clean clothes to the hospital/surgery center.  FAILURE TO FOLLOW THESE INSTRUCTIONS MAY RESULT IN THE CANCELLATION OF YOUR SURGERY PATIENT SIGNATURE_________________________________  NURSE SIGNATURE__________________________________  ________________________________________________________________________   Kayla Tate  An incentive spirometer is a tool that can help keep your lungs clear and active. This tool measures how well you are filling your lungs with each breath. Taking long deep breaths may help reverse or decrease the chance of developing breathing (pulmonary) problems (especially  infection) following:  A long period of time when you are unable to move or be active. BEFORE THE PROCEDURE   If the spirometer includes an indicator to show your best effort, your nurse or respiratory therapist will set it to a desired goal.  If possible, sit up straight or lean slightly forward. Try not to slouch.  Hold the incentive spirometer in an upright position. INSTRUCTIONS FOR USE  1. Sit on the edge of your bed if possible, or sit up as far as you can in bed or on a chair. 2. Hold the incentive spirometer in an upright position. 3. Breathe out normally. 4. Place the mouthpiece in your mouth and seal your lips tightly around it. 5. Breathe in slowly and as deeply as possible, raising the piston or the ball toward the top of the column. 6. Hold your breath for 3-5 seconds or for as long as possible. Allow the piston or ball to fall to the bottom of the column. 7. Remove the mouthpiece from your mouth and breathe out normally. 8. Rest for a few seconds and repeat Steps 1 through 7 at least 10 times every 1-2 hours when you are awake. Take your time and take a few normal breaths between deep breaths. 9. The spirometer may include an indicator to show your best effort. Use the indicator as a goal to work toward during each repetition. 10. After each set of 10 deep breaths, practice coughing to be sure your lungs are clear. If you have an incision (the cut made at the time of surgery), support your incision when coughing by placing a pillow or rolled up towels firmly against it. Once you are able to get out of bed, walk around indoors and cough well. You may stop using the incentive spirometer when instructed by your caregiver.  RISKS AND COMPLICATIONS  Take your time so you do not get dizzy or light-headed.  If you are in pain, you may need to take or ask for pain medication before doing incentive spirometry. It is harder to take a deep breath if you are having pain. AFTER  USE  Rest and breathe slowly and easily.  It can be helpful to keep track of a log of your progress. Your caregiver can provide you with a simple table to help with this. If you are using the spirometer at home, follow these instructions: [place] IF:   You are having difficultly using the spirometer.  You have trouble using the spirometer as often as instructed.  Your pain medication is not giving enough relief while using the spirometer.  You develop fever of 100.5 F (38.1 C) or higher. SEEK IMMEDIATE MEDICAL CARE IF:   You cough up bloody sputum that had not been present before.  You develop fever of 102 F (38.9 C) or greater.  You develop worsening pain at or near the incision site. MAKE SURE YOU:   Understand these instructions.  Will watch your condition.  Will get help right away if you are not doing well or get worse. Document Released: 08/18/2006 Document Revised: 06/30/2011 Document Reviewed: 10/19/2006 [facility] Patient Information 2014 [place], [place].   ________________________________________________________________________

## 2019-08-05 ENCOUNTER — Encounter (HOSPITAL_COMMUNITY): Payer: BC Managed Care – PPO

## 2019-08-05 ENCOUNTER — Encounter (HOSPITAL_COMMUNITY)
Admission: RE | Admit: 2019-08-05 | Discharge: 2019-08-05 | Disposition: A | Discharge status: Home / Self Care | Payer: BC Managed Care – PPO | Source: Ambulatory Visit | Attending: Obstetrics & Gynecology | Admitting: Obstetrics & Gynecology

## 2019-08-05 ENCOUNTER — Encounter (HOSPITAL_COMMUNITY): Payer: Self-pay

## 2019-08-05 ENCOUNTER — Other Ambulatory Visit: Payer: Self-pay

## 2019-08-05 DIAGNOSIS — Z01812 Encounter for preprocedural laboratory examination: Secondary | ICD-10-CM | POA: Insufficient documentation

## 2019-08-05 LAB — TYPE AND SCREEN
ABO/RH(D): O POS
Antibody Screen: NEGATIVE

## 2019-08-05 LAB — CBC
HCT: 42 % (ref 36.0–46.0)
Hemoglobin: 13.5 g/dL (ref 12.0–15.0)
MCH: 29.4 pg (ref 26.0–34.0)
MCHC: 32.1 g/dL (ref 30.0–36.0)
MCV: 91.5 fL (ref 80.0–100.0)
Platelets: 203 10*3/uL (ref 150–400)
RBC: 4.59 MIL/uL (ref 3.87–5.11)
RDW: 12.7 % (ref 11.5–15.5)
WBC: 5.3 10*3/uL (ref 4.0–10.5)
nRBC: 0 % (ref 0.0–0.2)

## 2019-08-05 LAB — ABO/RH: ABO/RH(D): O POS

## 2019-08-05 NOTE — Progress Notes (Signed)
PCP - Dr. Loni Muse Plotnikov Cardiologist - none  Chest x-ray - no EKG - no Stress Test - no ECHO - no Cardiac Cath - no  Sleep Study - no CPAP -   Fasting Blood Sugar - NA Checks Blood Sugar _____ times a day  Blood Thinner Instructions:NA Aspirin Instructions: Last Dose:  Anesthesia review:   Patient denies shortness of breath, fever, cough and chest pain at PAT appointment yes  Patient verbalized understanding of instructions that were given to them at the PAT appointment. Patient was also instructed that they will need to review over the PAT instructions again at home before surgery. yes

## 2019-08-12 ENCOUNTER — Other Ambulatory Visit (HOSPITAL_COMMUNITY)
Admission: RE | Admit: 2019-08-12 | Discharge: 2019-08-12 | Disposition: A | Discharge status: Home / Self Care | Payer: BC Managed Care – PPO | Source: Ambulatory Visit | Attending: Obstetrics & Gynecology | Admitting: Obstetrics & Gynecology

## 2019-08-12 DIAGNOSIS — Z20822 Contact with and (suspected) exposure to covid-19: Secondary | ICD-10-CM | POA: Diagnosis not present

## 2019-08-12 DIAGNOSIS — Z01812 Encounter for preprocedural laboratory examination: Secondary | ICD-10-CM | POA: Insufficient documentation

## 2019-08-12 LAB — SARS CORONAVIRUS 2 (TAT 6-24 HRS): SARS Coronavirus 2: NEGATIVE

## 2019-08-16 ENCOUNTER — Ambulatory Visit (HOSPITAL_BASED_OUTPATIENT_CLINIC_OR_DEPARTMENT_OTHER): Payer: BC Managed Care – PPO | Admitting: Anesthesiology

## 2019-08-16 ENCOUNTER — Encounter (HOSPITAL_BASED_OUTPATIENT_CLINIC_OR_DEPARTMENT_OTHER): Payer: Self-pay | Admitting: Obstetrics & Gynecology

## 2019-08-16 ENCOUNTER — Encounter (HOSPITAL_BASED_OUTPATIENT_CLINIC_OR_DEPARTMENT_OTHER)
Admission: RE | Disposition: A | Discharge status: Home / Self Care | Payer: Self-pay | Source: Home / Self Care | Attending: Obstetrics & Gynecology

## 2019-08-16 ENCOUNTER — Ambulatory Visit (HOSPITAL_BASED_OUTPATIENT_CLINIC_OR_DEPARTMENT_OTHER)
Admission: RE | Admit: 2019-08-16 | Discharge: 2019-08-16 | Disposition: A | Discharge status: Home / Self Care | Payer: BC Managed Care – PPO | Attending: Obstetrics & Gynecology | Admitting: Obstetrics & Gynecology

## 2019-08-16 DIAGNOSIS — N813 Complete uterovaginal prolapse: Secondary | ICD-10-CM | POA: Diagnosis not present

## 2019-08-16 DIAGNOSIS — Z9889 Other specified postprocedural states: Secondary | ICD-10-CM

## 2019-08-16 DIAGNOSIS — N814 Uterovaginal prolapse, unspecified: Secondary | ICD-10-CM | POA: Diagnosis not present

## 2019-08-16 DIAGNOSIS — Z780 Asymptomatic menopausal state: Secondary | ICD-10-CM | POA: Diagnosis not present

## 2019-08-16 HISTORY — PX: XI ROBOTIC ASSISTED TOTAL HYSTERECTOMY WITH SACROCOLPOPEXY: SHX6825

## 2019-08-16 LAB — CBC
HCT: 41 % (ref 36.0–46.0)
Hemoglobin: 13 g/dL (ref 12.0–15.0)
MCH: 28.8 pg (ref 26.0–34.0)
MCHC: 31.7 g/dL (ref 30.0–36.0)
MCV: 90.7 fL (ref 80.0–100.0)
Platelets: 253 10*3/uL (ref 150–400)
RBC: 4.52 MIL/uL (ref 3.87–5.11)
RDW: 12.6 % (ref 11.5–15.5)
WBC: 12.9 10*3/uL — ABNORMAL HIGH (ref 4.0–10.5)
nRBC: 0 % (ref 0.0–0.2)

## 2019-08-16 LAB — POCT PREGNANCY, URINE: Preg Test, Ur: NEGATIVE

## 2019-08-16 SURGERY — HYSTERECTOMY, TOTAL, ROBOT-ASSISTED, WITH SACROCOLPOPEXY
Anesthesia: General | Site: Abdomen

## 2019-08-16 MED ORDER — MIDAZOLAM HCL 5 MG/5ML IJ SOLN
INTRAMUSCULAR | Status: DC | PRN
  Administered 2019-08-16: 2 mg via INTRAVENOUS

## 2019-08-16 MED ORDER — KETOROLAC TROMETHAMINE 15 MG/ML IJ SOLN
15.0000 mg | Freq: Once | INTRAMUSCULAR | Status: AC
  Administered 2019-08-16: 15 mg via INTRAVENOUS

## 2019-08-16 MED ORDER — CEFAZOLIN SODIUM-DEXTROSE 2-4 GM/100ML-% IV SOLN
2.0000 g | INTRAVENOUS | Status: AC
  Administered 2019-08-16 (×2): 2 g via INTRAVENOUS

## 2019-08-16 MED ORDER — KETOROLAC TROMETHAMINE 15 MG/ML IJ SOLN
INTRAMUSCULAR | Status: AC
  Filled 2019-08-16: qty 1

## 2019-08-16 MED ORDER — MIDAZOLAM HCL 2 MG/2ML IJ SOLN
INTRAMUSCULAR | Status: AC
  Filled 2019-08-16: qty 2

## 2019-08-16 MED ORDER — ONDANSETRON HCL 4 MG PO TABS: 4.0000 mg | ORAL_TABLET | Freq: Three times a day (TID) | ORAL | 0 refills | Status: DC | PRN

## 2019-08-16 MED ORDER — KETOROLAC TROMETHAMINE 30 MG/ML IJ SOLN
INTRAMUSCULAR | Status: AC
  Filled 2019-08-16: qty 1

## 2019-08-16 MED ORDER — ONDANSETRON 4 MG PO TBDP
ORAL_TABLET | ORAL | Status: AC
  Filled 2019-08-16: qty 1

## 2019-08-16 MED ORDER — HYDROMORPHONE HCL 1 MG/ML IJ SOLN: 0.5000 mg | INTRAMUSCULAR | Status: DC | PRN

## 2019-08-16 MED ORDER — OXYCODONE-ACETAMINOPHEN 5-325 MG PO TABS: 2.0000 | ORAL_TABLET | ORAL | Status: DC | PRN

## 2019-08-16 MED ORDER — PROMETHAZINE HCL 25 MG/ML IJ SOLN: 6.2500 mg | INTRAMUSCULAR | Status: DC | PRN

## 2019-08-16 MED ORDER — ROCURONIUM BROMIDE 10 MG/ML (PF) SYRINGE
PREFILLED_SYRINGE | INTRAVENOUS | Status: AC
  Filled 2019-08-16: qty 10

## 2019-08-16 MED ORDER — PROPOFOL 10 MG/ML IV BOLUS
INTRAVENOUS | Status: AC
  Filled 2019-08-16: qty 20

## 2019-08-16 MED ORDER — PROPOFOL 500 MG/50ML IV EMUL
INTRAVENOUS | Status: DC | PRN
  Administered 2019-08-16: 25 ug/kg/min via INTRAVENOUS

## 2019-08-16 MED ORDER — HYDROCODONE-ACETAMINOPHEN 5-325 MG PO TABS: 1.0000 | ORAL_TABLET | ORAL | Status: DC | PRN

## 2019-08-16 MED ORDER — LIDOCAINE 2% (20 MG/ML) 5 ML SYRINGE
INTRAMUSCULAR | Status: AC
  Filled 2019-08-16: qty 5

## 2019-08-16 MED ORDER — HYDROMORPHONE HCL 1 MG/ML IJ SOLN: 0.2500 mg | INTRAMUSCULAR | Status: DC | PRN

## 2019-08-16 MED ORDER — LACTATED RINGERS IV SOLN: INTRAVENOUS | Status: DC | PRN

## 2019-08-16 MED ORDER — OXYCODONE-ACETAMINOPHEN 7.5-325 MG PO TABS: 1.0000 | ORAL_TABLET | Freq: Four times a day (QID) | ORAL | 0 refills | Status: DC | PRN

## 2019-08-16 MED ORDER — LACTATED RINGERS IV SOLN: INTRAVENOUS | Status: DC

## 2019-08-16 MED ORDER — ONDANSETRON HCL 4 MG/2ML IJ SOLN
INTRAMUSCULAR | Status: AC
  Filled 2019-08-16: qty 2

## 2019-08-16 MED ORDER — ONDANSETRON HCL 4 MG/2ML IJ SOLN: 4.0000 mg | Freq: Four times a day (QID) | INTRAMUSCULAR | Status: DC | PRN

## 2019-08-16 MED ORDER — CEFAZOLIN SODIUM 1 G IJ SOLR
INTRAMUSCULAR | Status: AC
  Filled 2019-08-16: qty 20

## 2019-08-16 MED ORDER — FENTANYL CITRATE (PF) 100 MCG/2ML IJ SOLN
INTRAMUSCULAR | Status: DC | PRN
  Administered 2019-08-16: 50 ug via INTRAVENOUS
  Administered 2019-08-16: 25 ug via INTRAVENOUS
  Administered 2019-08-16 (×3): 50 ug via INTRAVENOUS
  Administered 2019-08-16: 25 ug via INTRAVENOUS

## 2019-08-16 MED ORDER — DEXAMETHASONE SODIUM PHOSPHATE 10 MG/ML IJ SOLN
INTRAMUSCULAR | Status: AC
  Filled 2019-08-16: qty 1

## 2019-08-16 MED ORDER — ONDANSETRON 4 MG PO TBDP: 4.0000 mg | ORAL_TABLET | Freq: Four times a day (QID) | ORAL | Status: DC | PRN

## 2019-08-16 MED ORDER — DEXAMETHASONE SODIUM PHOSPHATE 10 MG/ML IJ SOLN
INTRAMUSCULAR | Status: DC | PRN
  Administered 2019-08-16: 5 mg via INTRAVENOUS

## 2019-08-16 MED ORDER — ROCURONIUM BROMIDE 100 MG/10ML IV SOLN
INTRAVENOUS | Status: DC | PRN
  Administered 2019-08-16 (×2): 20 mg via INTRAVENOUS
  Administered 2019-08-16 (×2): 10 mg via INTRAVENOUS
  Administered 2019-08-16: 70 mg via INTRAVENOUS
  Administered 2019-08-16: 10 mg via INTRAVENOUS

## 2019-08-16 MED ORDER — BUPIVACAINE HCL (PF) 0.25 % IJ SOLN
INTRAMUSCULAR | Status: DC | PRN
  Administered 2019-08-16: 14 mL

## 2019-08-16 MED ORDER — CEFAZOLIN SODIUM-DEXTROSE 2-4 GM/100ML-% IV SOLN
INTRAVENOUS | Status: AC
  Filled 2019-08-16: qty 100

## 2019-08-16 MED ORDER — KETOROLAC TROMETHAMINE 30 MG/ML IJ SOLN
INTRAMUSCULAR | Status: DC | PRN
Start: 2019-08-16 — End: 2019-08-16
  Administered 2019-08-16: 30 mg via INTRAVENOUS

## 2019-08-16 MED ORDER — MEPERIDINE HCL 25 MG/ML IJ SOLN: 6.2500 mg | INTRAMUSCULAR | Status: DC | PRN

## 2019-08-16 MED ORDER — ACETAMINOPHEN 325 MG PO TABS: 650.0000 mg | ORAL_TABLET | ORAL | Status: DC | PRN

## 2019-08-16 MED ORDER — LIDOCAINE 2% (20 MG/ML) 5 ML SYRINGE
INTRAMUSCULAR | Status: DC | PRN
  Administered 2019-08-16: 50 mg via INTRAVENOUS

## 2019-08-16 MED ORDER — HYDROMORPHONE HCL 1 MG/ML IJ SOLN
INTRAMUSCULAR | Status: DC | PRN
  Administered 2019-08-16 (×2): .5 mg via INTRAVENOUS

## 2019-08-16 MED ORDER — ONDANSETRON 4 MG PO TBDP
4.0000 mg | ORAL_TABLET | Freq: Four times a day (QID) | ORAL | Status: DC | PRN
  Administered 2019-08-16: 4 mg via ORAL

## 2019-08-16 MED ORDER — ONDANSETRON HCL 4 MG/2ML IJ SOLN
INTRAMUSCULAR | Status: DC | PRN
  Administered 2019-08-16 (×2): 4 mg via INTRAVENOUS

## 2019-08-16 MED ORDER — SUGAMMADEX SODIUM 200 MG/2ML IV SOLN
INTRAVENOUS | Status: DC | PRN
  Administered 2019-08-16: 120 mg via INTRAVENOUS

## 2019-08-16 MED ORDER — FENTANYL CITRATE (PF) 250 MCG/5ML IJ SOLN
INTRAMUSCULAR | Status: AC
  Filled 2019-08-16: qty 5

## 2019-08-16 MED ORDER — HYDROMORPHONE HCL 2 MG/ML IJ SOLN
INTRAMUSCULAR | Status: AC
  Filled 2019-08-16: qty 1

## 2019-08-16 MED ORDER — PROPOFOL 10 MG/ML IV BOLUS
INTRAVENOUS | Status: DC | PRN
  Administered 2019-08-16: 150 mg via INTRAVENOUS

## 2019-08-16 SURGICAL SUPPLY — 63 items
ADH SKN CLS APL DERMABOND .7 (GAUZE/BANDAGES/DRESSINGS) ×1
BARRIER ADHS 3X4 INTERCEED (GAUZE/BANDAGES/DRESSINGS) IMPLANT
BRR ADH 4X3 ABS CNTRL BYND (GAUZE/BANDAGES/DRESSINGS)
CATH FOLEY 3WAY  5CC 16FR (CATHETERS) ×3
CATH FOLEY 3WAY 5CC 16FR (CATHETERS) ×1 IMPLANT
COVER BACK TABLE 60X90IN (DRAPES) ×3 IMPLANT
COVER TIP SHEARS 8 DVNC (MISCELLANEOUS) ×1 IMPLANT
COVER TIP SHEARS 8MM DA VINCI (MISCELLANEOUS) ×3
COVER WAND RF STERILE (DRAPES) ×3 IMPLANT
DECANTER SPIKE VIAL GLASS SM (MISCELLANEOUS) ×4 IMPLANT
DEFOGGER SCOPE WARMER CLEARIFY (MISCELLANEOUS) ×3 IMPLANT
DERMABOND ADVANCED (GAUZE/BANDAGES/DRESSINGS) ×2
DERMABOND ADVANCED .7 DNX12 (GAUZE/BANDAGES/DRESSINGS) ×1 IMPLANT
DRAPE ARM DVNC X/XI (DISPOSABLE) ×4 IMPLANT
DRAPE COLUMN DVNC XI (DISPOSABLE) ×1 IMPLANT
DRAPE DA VINCI XI ARM (DISPOSABLE) ×12
DRAPE DA VINCI XI COLUMN (DISPOSABLE) ×3
DRSG OPSITE POSTOP 3X4 (GAUZE/BANDAGES/DRESSINGS) ×1 IMPLANT
DURAPREP 26ML APPLICATOR (WOUND CARE) ×3 IMPLANT
ELECT REM PT RETURN 9FT ADLT (ELECTROSURGICAL) ×3
ELECTRODE REM PT RTRN 9FT ADLT (ELECTROSURGICAL) ×1 IMPLANT
GAUZE VASELINE 3X9 (GAUZE/BANDAGES/DRESSINGS) ×3 IMPLANT
GLOVE BIO SURGEON STRL SZ 6.5 (GLOVE) ×6 IMPLANT
GLOVE BIO SURGEONS STRL SZ 6.5 (GLOVE) ×3
GLOVE BIOGEL PI IND STRL 7.0 (GLOVE) ×5 IMPLANT
GLOVE BIOGEL PI INDICATOR 7.0 (GLOVE) ×10
IRRIG SUCT STRYKERFLOW 2 WTIP (MISCELLANEOUS) ×3
IRRIGATION SUCT STRKRFLW 2 WTP (MISCELLANEOUS) ×1 IMPLANT
LEGGING LITHOTOMY PAIR STRL (DRAPES) ×3 IMPLANT
MESH ARTISYN Y (Mesh General) ×2 IMPLANT
OBTURATOR OPTICAL STANDARD 8MM (TROCAR) ×3
OBTURATOR OPTICAL STND 8 DVNC (TROCAR) ×1
OBTURATOR OPTICALSTD 8 DVNC (TROCAR) ×1 IMPLANT
OCCLUDER COLPOPNEUMO (BALLOONS) IMPLANT
PACK ROBOT WH (CUSTOM PROCEDURE TRAY) ×3 IMPLANT
PACK ROBOTIC GOWN (GOWN DISPOSABLE) ×3 IMPLANT
PACK TRENDGUARD 450 HYBRID PRO (MISCELLANEOUS) IMPLANT
PAD PREP 24X48 CUFFED NSTRL (MISCELLANEOUS) ×3 IMPLANT
PORT ACCESS TROCAR AIRSEAL 12 (TROCAR) ×1 IMPLANT
PORT ACCESS TROCAR AIRSEAL 5M (TROCAR)
PROTECTOR NERVE ULNAR (MISCELLANEOUS) ×2 IMPLANT
SEAL CANN UNIV 5-8 DVNC XI (MISCELLANEOUS) ×4 IMPLANT
SEAL XI 5MM-8MM UNIVERSAL (MISCELLANEOUS) ×12
SET IRRIG Y TYPE TUR BLADDER L (SET/KITS/TRAYS/PACK) IMPLANT
SET TRI-LUMEN FLTR TB AIRSEAL (TUBING) ×2 IMPLANT
SET TUBE SMOKE EVAC HIGH FLOW (TUBING) ×1 IMPLANT
SLEEVE SURGEON STRL (DRAPES) ×2 IMPLANT
SUT ETHIBOND 0 (SUTURE) IMPLANT
SUT GORETEX NAB #0 THX26 36IN (SUTURE) ×19 IMPLANT
SUT VIC AB 4-0 PS2 27 (SUTURE) ×6 IMPLANT
SUT VICRYL 0 UR6 27IN ABS (SUTURE) ×3 IMPLANT
SUT VLOC 180 2-0 6IN GS21 (SUTURE) ×2 IMPLANT
SUT VLOC 180 2-0 9IN GS21 (SUTURE) IMPLANT
SUT VLOC NONABS 0 BL6 GS-21 (SUTURE) IMPLANT
SYS RETRIEVAL 5MM INZII UNIV (BASKET) ×3
SYSTEM RETRIEVL 5MM INZII UNIV (BASKET) IMPLANT
TIP UTERINE 5.1X6CM LAV DISP (MISCELLANEOUS) ×2 IMPLANT
TIP UTERINE 6.7X10CM GRN DISP (MISCELLANEOUS) IMPLANT
TIP UTERINE 6.7X6CM WHT DISP (MISCELLANEOUS) IMPLANT
TIP UTERINE 6.7X8CM BLUE DISP (MISCELLANEOUS) ×2 IMPLANT
TOWEL OR 17X26 10 PK STRL BLUE (TOWEL DISPOSABLE) ×6 IMPLANT
TRENDGUARD 450 HYBRID PRO PACK (MISCELLANEOUS) ×3
TROCAR PORT AIRSEAL 8X120 (TROCAR) ×2 IMPLANT

## 2019-08-16 NOTE — Discharge Instructions (Signed)
Sacrocolpopexy, Care After This sheet gives you information about how to care for yourself after your procedure. Your health care provider may also give you more specific instructions. If you have problems or questions, contact your health care provider. What can I expect after the procedure? After the procedure, it is common to have:  Pain.  Some vaginal bleeding.  Tiredness (fatigue). Follow these instructions at home: Medicines  Take over-the-counter and prescription medicines only as told by your health care provider.  If you were prescribed an antibiotic medicine, take it as told by your health care provider. Do not stop taking the antibiotic even if you start to feel better.  Do not drive or use heavy machinery while taking prescription pain medicine. Incision care   Follow instructions from your health care provider about how to take care of your incisions. Make sure you: ? Wash your hands with soap and water before you change your bandage (dressing). If soap and water are not available, use hand sanitizer. ? Change your dressing as told by your health care provider. ? Leave stitches (sutures), skin glue, or adhesive strips in place. These skin closures may need to stay in place for 2 weeks or longer. If adhesive strip edges start to loosen and curl up, you may trim the loose edges. Do not remove adhesive strips completely unless your health care provider tells you to do that.  Check your incision areas every day for signs of infection. Check for: ? Redness, swelling, or pain. ? Fluid or blood. ? Warmth. ? Pus or a bad smell.  Do not take baths, swim, or use a hot tub until your health care provider says it is okay to do so. Do not shower for 24 hours or until after your bandages have been removed. Activity  Take frequent, short walks throughout the day. Rest when you get tired.  Limit your activities as told by your health care provider. For at least 6 weeks: ? Do not  lift anything that is heavier than 10 lb (4.5 kg), or the limit that your health care provider tells you, until he or she says that it is safe. ? Do not sit on a bike seat. ? Do not use a tampon or put anything inside your vagina. ? Do not have sexual intercourse. ? Do not participate in activities that take a lot of effort (strenuous), such as running or aerobics.  Ask your health care provider when you can return to work and do all your usual activities. It may take 3 months to recover completely. General instructions  To prevent or treat constipation while you are taking prescription pain medicine, your health care provider may recommend that you: ? Drink enough fluid to keep your urine pale yellow. ? Take over-the-counter or prescription medicines. ? Eat foods that are high in fiber, such as fresh fruits and vegetables, whole grains, and beans. ? Limit foods that are high in fat and processed sugars, such as fried and sweet foods.  Keep all follow-up visits as told by your health care provider. This is important. Contact a health care provider if:  You have chills or a fever.  Your pain medicine is not helping.  You have vaginal bleeding or discharge that will not go away.  You have any signs of infection, such as: ? Redness, swelling, or pain around your incision. ? Fluid or blood coming from your incision. ? Your incision feeling warm to the touch. ? Pus or a bad smell   coming from your incision. Get help right away if:  You have a fever for more than 2-3 days.  You have very bad pain.  You have heavy vaginal bleeding or discharge.  You have a foul smell coming from your vagina area.  You develop a warm, tender area in your leg.  You have chest pain or trouble breathing. Summary  After the procedure, it is common to have some vaginal bleeding.  Limit your activities as told by your health care provider. For at least 6 weeks, do not lift anything heavy, have sexual  intercourse, use tampons, sit on a bike seat, or participate in strenuous activities.  Follow instructions from your health care provider about how to take care of your incisions. Check your incision areas every day for signs of infection.  To prevent blood clots, take frequent, short walks throughout the day. Rest when you get tired. This information is not intended to replace advice given to you by your health care provider. Make sure you discuss any questions you have with your health care provider. Document Revised: 03/20/2017 Document Reviewed: 07/03/2016 Elsevier Patient Education  2020 [employer]  

## 2019-08-16 NOTE — Transfer of Care (Signed)
Immediate Anesthesia Transfer of Care Note  Patient: Kayla Tate  Procedure(s) Performed: XI ROBOTIC ASSISTED SUPRACERVICAL HYSTERECTOMY WITH BILATERAL SALPINGO OOPHERECTOMY AND SACROCOLPOPEXY (N/A Abdomen)  Patient Location: PACU  Anesthesia Type:General  Level of Consciousness: drowsy and patient cooperative  Airway & Oxygen Therapy: Patient Spontanous Breathing and Patient connected to nasal cannula oxygen  Post-op Assessment: Report given to RN and Post -op Vital signs reviewed and stable  Post vital signs: Reviewed and stable  Last Vitals:  Vitals Value Taken Time  BP 111/70 08/16/19 1415  Temp 35.9 C 08/16/19 1415  Pulse 78 08/16/19 1416  Resp 10 08/16/19 1416  SpO2 99 % 08/16/19 1416  Vitals shown include unvalidated device data.  Last Pain:  Vitals:   08/16/19 0644  TempSrc: Oral  PainSc: 0-No pain      Patients Stated Pain Goal: 4 ([id number])  Complications: No apparent anesthesia complications

## 2019-08-16 NOTE — Progress Notes (Signed)
08/16/2019 7:53 PM During ambulation, pt. C/o nausea. Pt. Did not vomit but needed to sit for a moment during walk. Dr. Dellis Filbert paged and made aware of nausea and low UOP with one void since foley removal totaling 50 ml clear pale yellow urine. Verbal order received for Zofran 4 mg PO or IV q6h PRN. Orders placed. Upon offering medication to patient, she refused stating she was no longer nauseous and wanted to re-attempt ambulation. Pt. Able to ambulate entire PACU area without nausea or pain. Dr. Dellis Filbert updated on patient via phone. MD to see patient for possible d/c home.  Marieke Lubke, Arville Lime

## 2019-08-16 NOTE — H&P (Signed)
Kayla Tate is an 59 y.o. female. G2P2L2 Married  RP: Complete Uterine Prolapse for XI Robotic Supracervical Hysterectomy with BSO and Sacrocolpopexy  [id number], well on no hormone replacement therapy. No postmenopausal bleeding. No pelvic pain. Worsening discomfort and irritation associated with progression of complete uterine prolapse,now protruding more and more at the vulva most of the time, ready to proceed with surgery, scheduled for 08/16/2019.Had a pessary, but not using it because it was interfering with her bowel movements. Minor stress urinary incontinence. Bowel movements normal currently.   Pertinent Gynecological History: Menses: post-menopausal Blood transfusions: none Sexually transmitted diseases: no past history Last mammogram: normal  Last pap: normal  OB History: G2, P2   Menstrual History: Patient's last menstrual period was 02/27/2014.    Past Medical History:  Diagnosis Date  . Anemia    iron def  . Wrist fracture, right 2007    Past Surgical History:  Procedure Laterality Date  . BIOPSY BREAST  1998  . KNEE LIGAMENT RECONSTRUCTION  1985   Left  . MOUTH SURGERY      Family History  Problem Relation Age of Onset  . Heart disease Mother 36       CAD  . Hypertension Mother 80       CAD  . Pneumonia Father   . Colon cancer Neg Hx   . Esophageal cancer Neg Hx   . Stomach cancer Neg Hx   . Rectal cancer Neg Hx     Social History:  reports that she has never smoked. She has never used smokeless tobacco. She reports that she does not drink alcohol or use drugs.  Allergies:  Allergies  Allergen Reactions  . Shingrix [Zoster Vac Recomb Adjuvanted]     Local reaction    Medications Prior to Admission  Medication Sig Dispense Refill Last Dose  . Cholecalciferol 1000 UNITS tablet Take 1,000 Units by mouth daily.    08/15/2019 at Unknown time    REVIEW OF SYSTEMS: A ROS was performed and pertinent positives and negatives are  included in the history.  GENERAL: No fevers or chills. HEENT: No change in vision, no earache, sore throat or sinus congestion. NECK: No pain or stiffness. CARDIOVASCULAR: No chest pain or pressure. No palpitations. PULMONARY: No shortness of breath, cough or wheeze. GASTROINTESTINAL: No abdominal pain, nausea, vomiting or diarrhea, melena or bright red blood per rectum. GENITOURINARY: No urinary frequency, urgency, hesitancy or dysuria. MUSCULOSKELETAL: No joint or muscle pain, no back pain, no recent trauma. DERMATOLOGIC: No rash, no itching, no lesions. ENDOCRINE: No polyuria, polydipsia, no heat or cold intolerance. No recent change in weight. HEMATOLOGICAL: No anemia or easy bruising or bleeding. NEUROLOGIC: No headache, seizures, numbness, tingling or weakness. PSYCHIATRIC: No depression, no loss of interest in normal activity or change in sleep pattern.     Blood pressure 110/72, pulse 61, temperature (!) 97.3 F (36.3 C), temperature source Oral, resp. rate 14, height 5\' 4"  (1.626 m), weight 62.1 kg, last menstrual period 02/27/2014, SpO2 100 %.  Physical Exam:  See office notes   Results for orders placed or performed during the hospital encounter of 08/16/19 (from the past 24 hour(s))  Pregnancy, urine POC     Status: None   Collection Time: 08/16/19  6:54 AM  Result Value Ref Range   Preg Test, Ur NEGATIVE NEGATIVE   Covid Negative  Hb 13.5 on 08/05/2019  Office exam 07/08/2019:  Abdomen: no palpable masses or tenderness, no rebound or guarding Extremities: no edema or  skin discoloration or tenderness  Pelvic: Vulva: Normal             Vagina: No gross lesions or discharge.             Cervix: No gross lesions or discharge.  Cervix completely protruding at the vulva.  Dry, but no ulceration.  Pap reflex done.             Uterus Complete prolapse, normal size, shape and consistency, non-tender and mobile.               Adnexa  Without masses or tenderness             Anus:  Normal   Assessment/Plan:  59 y.o. female for annual exam   1. Complete uterine prolapse Symptomatic complete uterine prolapse with cervix protruding completely at the vulva.  Has used the pessary but given that she is very active, decision made to proceed with surgical correction.  XI robotic supracervical hysterectomy with BSO and sacrocolpopexy scheduled on August 16, 2019.  Preop preparation, surgical procedure with risks and benefits thoroughly reviewed, as well as postop precautions and expectations.  Patient voiced understanding and agreement with plan.  2. Postmenopausal Well on no hormone replacement therapy.  No postmenopausal bleeding.  Vitamin D supplements, calcium intake of 1.2 g/day and regular weightbearing physical activities to continue.                        Patient was counseled as to the risk of surgery to include the following:  1. Infection (prohylactic antibiotics will be administered)  2. DVT/Pulmonary Embolism (prophylactic pneumo compression stockings will be used)  3.Trauma to internal organs requiring additional surgical procedure to repair any injury to internal organs requiring perhaps additional hospitalization days.  4.Hemmorhage requiring transfusion and blood products which carry risks such as anaphylactic reaction, hepatitis and AIDS  Patient had received literature information on the procedure scheduled and all her questions were answered and fully accepts all risk.    Marie-Lyne Alejo Beamer 08/16/2019, 7:59 AM

## 2019-08-16 NOTE — Op Note (Signed)
Operative Note  08/16/2019  2:12 PM  PATIENT:  Kayla Tate  59 y.o. female  PRE-OPERATIVE DIAGNOSIS:  Complete uterine prolapse  POST-OPERATIVE DIAGNOSIS:  Complete uterine prolapse  PROCEDURE:  Procedure(s): XI ROBOTIC ASSISTED SUPRACERVICAL HYSTERECTOMY WITH BILATERAL SALPINGO OOPHERECTOMY AND SACROCOLPOPEXY  SURGEON:  Surgeon(s): Joseph Pierini, MD Princess Bruins, MD  ANESTHESIA:   general  FINDINGS: Uterus, tubes and ovaries normal.  Complete Uterine Prolapse.  DESCRIPTION OF OPERATION: Under general anesthesia with endotracheal intubation the patient is in lithotomy position.  She is prepped with DuraPrep on the abdomen and with Betadine on the suprapubic, vulvar and vaginal areas.  She is draped as usual.  Timeout is done.  The Foley is inserted in the bladder.  Hysterometry is at 8 cm.  We used a #6 Rumi with a small Koh ring.  The other instruments are removed.  We go to the abdomen.  Infiltration of Marcaine one quarter plain at the supraumbilical area.  1.5 cm incision with the scalpel.  The aponeurosis is grasped with cokers and the Mayos were used to open the aponeurosis.  The parietal peritoneum is opened bluntly with the finger.  A pursestring stitch of Vicryl 0 is done on the aponeurosis.  The Sheryle Hail is inserted at that level and up pneumoperitoneum is created with CO2.  Inspection of the abdomen and pelvis are normal.  The anterior wall of the abdomen is free.  We make 2 small incisions after infiltrating Marcaine on the right side in line with the umbilicus and we make 2 small incisions on the left side after infiltration with Marcaine one quarter plain on the left side.  Robotic ports are entered under direct vision on the 2 right incisions and at the lateral left incision.  The assistant port a 8 mm port is inserted on the left medial side under direct vision.  The patient is placed in deep Trendelenburg 30 degrees.  The robot is docked.  Targeting is done.  We  insert robotic instruments with the fenestrated clamp in the fourth arm, the Metzenbaums scissors and the third arm and the PK in the first arm.  We go to the console.      We note that the uterus had been perforated by the Rumi at the lower right aspect and the Rumi is repositioned.  A small hematoma is present on the right lateral side but no active bleeding and urine is clear.  Both ureters are in normal anatomic position with good peristalsis.  We started on the right side cauterizing and sectioning the right infundibulopelvic ligament then following below the ovary and cauterizing and sectioning the right round ligament following down the broad ligament and stopping before the uterine artery.  We proceeded the same way on the left side.  We started distending the bladder.  We then cauterized the uterine arteries on either side above the level of the cervix.  We then continue to descend the bladder anteriorly well past the [facility] ring.  We then opened the peritoneum posteriorly and free the cervix and the superior posterior part of the vagina to be able to attached the mesh at that level.  We then section just above the cervix using the tip of the EndoShears scissors and section the uterus off of the cervix.  The specimen including the uterus without cervix the bilateral tubes and bilateral ovaries is put in the posterior cul-de-sac.  Hemostasis is adequate at all levels.  We then measured the anterior aspect at 4 cm  in the posterior aspect at 3 cm.  We cut the Y mesh at those lengths.  The mesh is then inserted in the pelvic cavity.  We use Gore-Tex and separate stitches to secure the mesh to the anterior vagina anterior cervix superior part of the cervix and posterior cervix as well as posterior vagina.  7 stitches of Gore-Tex are done anteriorly and 5 posteriorly.  An EEA large sizer is used vaginally to expose the vagina and to make sure that the sutures are not getting into the vagina.  We then opened the  peritoneum at the sacral promontry and dissect the promontry to expose the anterior ligament of the vertebrae.  We opened the visceral peritoneum from that point all the way to the posterior vagina creating a tunnel for the mesh.  Hemostasis is adequate at all levels.  The mesh is measured for the right suspension without tension.  We go vaginally to assure that the suspension is adequate without tension.  We then use Gore-Tex and 2 separate stitches to fix the tail of the mesh to the anterior ligament of the sacrum at the level of the promontry.  We then used a V-Loc 2-oh in a running suture to close the peritoneum and camouflage the mesh.  Hemostasis is adequate at all levels.  Pictures were taken before and after the sacrocolpopexy. The robotic instruments are removed and the robot is undocked.  We go by laparoscopy.  We used an Endobag 5 cm in which the specimen is inserted and removed from the abdominopelvic cavities.  The specimen including the uterus without cervix bilateral tubes and bilateral ovaries is sent to pathology.  Hemostasis is adequate at all levels.  All laparoscopic instruments are removed.  The CO2 is evacuated.  All ports are removed.  The supraumbilical incision is closed by doing figure-of-eight's with Vicryl 0 as the pursestring stitch broke.  We then closed the skin with a 7 cuticular suture of Vicryl 4-0 and add Dermabond.  The other incisions are closed with a subcuticular suture of Vicryl 4-0 and Dermabond.  Hemostasis is adequate at all incisions.  The patient is brought to recovery room in good and stable status.   ESTIMATED BLOOD LOSS: 100 mL   Intake/Output Summary (Last 24 hours) at 08/16/2019 1412 Last data filed at 08/16/2019 1403 Gross per 24 hour  Intake 1420 ml  Output 400 ml  Net 1020 ml     BLOOD ADMINISTERED:none   LOCAL MEDICATIONS USED:  MARCAINE     SPECIMEN:  Source of Specimen:  Uterus without cervix, bilateral tubes and ovaries.  DISPOSITION OF  SPECIMEN:  PATHOLOGY  COUNTS:  YES  PLAN OF CARE: Transfer to PACU  Marie-Lyne LavoieMD2:12 PM

## 2019-08-16 NOTE — Anesthesia Preprocedure Evaluation (Signed)
Anesthesia Evaluation  Patient identified by MRN, date of birth, ID band Patient awake    Reviewed: Allergy & Precautions, NPO status , Patient's Chart, lab work & pertinent test results  Airway Mallampati: I       Dental no notable dental hx. (+) Teeth Intact   Pulmonary neg pulmonary ROS,    Pulmonary exam normal breath sounds clear to auscultation       Cardiovascular negative cardio ROS Normal cardiovascular exam Rhythm:Regular Rate:Normal     Neuro/Psych  Headaches,    GI/Hepatic negative GI ROS, Neg liver ROS,   Endo/Other  negative endocrine ROS  Renal/GU negative Renal ROS  negative genitourinary   Musculoskeletal negative musculoskeletal ROS (+)   Abdominal Normal abdominal exam  (+)   Peds  Hematology negative hematology ROS (+)   Anesthesia Other Findings   Reproductive/Obstetrics negative OB ROS                             Anesthesia Physical Anesthesia Plan  ASA: II  Anesthesia Plan: General   Post-op Pain Management:    Induction: Intravenous  PONV Risk Score and Plan: 4 or greater and Ondansetron, Dexamethasone and Midazolam  Airway Management Planned: Oral ETT  Additional Equipment: None  Intra-op Plan:   Post-operative Plan: Extubation in OR  Informed Consent: I have reviewed the patients History and Physical, chart, labs and discussed the procedure including the risks, benefits and alternatives for the proposed anesthesia with the patient or authorized representative who has indicated his/her understanding and acceptance.     Dental advisory given  Plan Discussed with: CRNA  Anesthesia Plan Comments:         Anesthesia Quick Evaluation

## 2019-08-16 NOTE — Anesthesia Procedure Notes (Signed)
Procedure Name: Intubation Date/Time: 08/16/2019 8:26 AM Performed by: Gwyndolyn Saxon, CRNA Pre-anesthesia Checklist: Patient identified, Emergency Drugs available, Suction available and Patient being monitored Patient Re-evaluated:Patient Re-evaluated prior to induction Oxygen Delivery Method: Circle system utilized Preoxygenation: Pre-oxygenation with 100% oxygen Induction Type: IV induction Ventilation: Mask ventilation without difficulty Laryngoscope Size: Miller and 2 Grade View: Grade I Tube type: Oral Tube size: 7.0 mm Number of attempts: 1 Airway Equipment and Method: Patient positioned with wedge pillow and Stylet Placement Confirmation: ETT inserted through vocal cords under direct vision,  positive ETCO2 and breath sounds checked- equal and bilateral Secured at: 21 cm Tube secured with: Tape Dental Injury: Teeth and Oropharynx as per pre-operative assessment

## 2019-08-16 NOTE — Anesthesia Postprocedure Evaluation (Signed)
Anesthesia Post Note  Patient: Kayla Tate  Procedure(s) Performed: XI ROBOTIC ASSISTED SUPRACERVICAL HYSTERECTOMY WITH BILATERAL SALPINGO OOPHERECTOMY AND SACROCOLPOPEXY (N/A Abdomen)     Patient location during evaluation: PACU Anesthesia Type: General Level of consciousness: awake and sedated Pain management: pain level controlled Vital Signs Assessment: post-procedure vital signs reviewed and stable Respiratory status: spontaneous breathing Cardiovascular status: stable Postop Assessment: no apparent nausea or vomiting Anesthetic complications: no    Last Vitals:  Vitals:   08/16/19 1525 08/16/19 1550  BP: (P) 114/78 114/70  Pulse: (P) 65 60  Resp: (P) 14 16  Temp: (P) 36.8 C   SpO2: (P) 96% 96%    Last Pain:  Vitals:   08/16/19 1515  TempSrc:   PainSc: 0-No pain   Pain Goal: Patients Stated Pain Goal: 4 (08/16/19 0644)                 Huston Foley

## 2019-08-16 NOTE — Progress Notes (Signed)
PO 8 hours XI Robotic Supracervical Laparoscopic Hysterectomy/BSO/Sacrocolpopexy  Subjective: Patient reports nausea, tolerating PO and no problems voiding.    Objective: I have reviewed patient's vital signs.  vital signs, intake and output, medications and labs.  Vitals:   08/16/19 1700 08/16/19 1936  BP: 109/64 110/64  Pulse: 65 69  Resp: 16 17  Temp: 98.3 F (36.8 C) 98.2 F (36.8 C)  SpO2: 95% 96%   I/O last 3 completed shifts: In: 2120 [I.V.:2000; IV Piggyback:120] Out: 450 [Urine:350; Blood:100] Total I/O In: [id number] [P.O.:740; I.V.:500] Out: 450 [Urine:50; Emesis/NG output:400]  Results for orders placed or performed during the hospital encounter of 08/16/19 (from the past 24 hour(s))  Pregnancy, urine POC     Status: None   Collection Time: 08/16/19  6:54 AM  Result Value Ref Range   Preg Test, Ur NEGATIVE NEGATIVE  CBC     Status: Abnormal   Collection Time: 08/16/19  7:17 PM  Result Value Ref Range   WBC 12.9 (H) 4.0 - 10.5 K/uL   RBC 4.52 3.87 - 5.11 MIL/uL   Hemoglobin 13.0 12.0 - 15.0 g/dL   HCT 41.0 36.0 - 46.0 %   MCV 90.7 80.0 - 100.0 fL   MCH 28.8 26.0 - 34.0 pg   MCHC 31.7 30.0 - 36.0 g/dL   RDW 12.6 11.5 - 15.5 %   Platelets 253 150 - 400 K/uL   nRBC 0.0 0.0 - 0.2 %    EXAM General: alert and cooperative Resp: clear to auscultation bilaterally Cardio: regular rate and rhythm GI: soft, non-tender; bowel sounds normal; no masses,  no organomegaly and incision: clean, dry and intact Extremities: no edema, redness or tenderness in the calves or thighs Vaginal Bleeding: none  Assessment: s/p Procedure(s): XI ROBOTIC ASSISTED SUPRACERVICAL HYSTERECTOMY WITH BILATERAL SALPINGO OOPHERECTOMY AND SACROCOLPOPEXY: stable, progressing well and tolerating diet  Plan: Discharge home  LOS: 0 days   Princess Bruins, MD 08/16/2019 9:30 PM

## 2019-08-16 NOTE — Discharge Summary (Signed)
Physician Discharge Summary  Patient ID: Kayla Tate MRN: [id number] DOB/AGE: 58/22/1962 58 y.o.  Admit date: 08/16/2019 Discharge date: 08/16/2019  Admission Diagnoses: Complete Uterine Prolapse   Discharge Diagnoses: Postop state Active Problems:   Postoperative state   Post-operative state   Discharged Condition: good  Consults:None  Significant Diagnostic Studies: labs: Hb postop 13.0  Treatments:surgery: XI Supracervical Hysterectomy with Bilateral Salpingo-Oophorectomy and Sacrocolpopexy  Vitals:   08/16/19 1700 08/16/19 1936  BP: 109/64 110/64  Pulse: 65 69  Resp: 16 17  Temp: 98.3 F (36.8 C) 98.2 F (36.8 C)  SpO2: 95% 96%     Total I/O In: 1240 [P.O.:740; I.V.:500] Out: 450 [Urine:50; Emesis/NG output:400]   Hospital Course: Good  Discharge Exam: Normal Postop  Disposition: Discharge disposition: 01-Home or Self Care       Discharge Instructions    Discharge patient   Complete by: As directed    Discharge disposition: 01-Home or Self Care   Discharge patient date: 08/16/2019       Allergies as of 08/16/2019      Reactions   Shingrix [zoster Vac Recomb Adjuvanted]    Local reaction      Medication List    TAKE these medications   Cholecalciferol 25 MCG (1000 UT) tablet Take 1,000 Units by mouth daily.   ondansetron 4 MG tablet Commonly known as: ZOFRAN Take 1 tablet (4 mg total) by mouth every 8 (eight) hours as needed for nausea or vomiting.   oxyCODONE-acetaminophen 7.5-325 MG tablet Commonly known as: Percocet Take 1 tablet by mouth every 6 (six) hours as needed for severe pain.        Follow-up Information    Princess Bruins, MD Follow up in 2 week(s).   Specialty: Obstetrics and Gynecology Contact information: [address] [place] [address] [phone]            Signed: Princess Bruins 08/16/2019, 9:27 PM

## 2019-08-17 LAB — SURGICAL PATHOLOGY

## 2019-08-29 ENCOUNTER — Other Ambulatory Visit: Payer: Self-pay

## 2019-08-30 ENCOUNTER — Ambulatory Visit: Payer: BC Managed Care – PPO | Admitting: Obstetrics & Gynecology

## 2019-08-30 ENCOUNTER — Ambulatory Visit (INDEPENDENT_AMBULATORY_CARE_PROVIDER_SITE_OTHER): Payer: BC Managed Care – PPO | Admitting: Obstetrics & Gynecology

## 2019-08-30 ENCOUNTER — Encounter: Payer: Self-pay | Admitting: Obstetrics & Gynecology

## 2019-08-30 VITALS — BP 118/70

## 2019-08-30 DIAGNOSIS — Z09 Encounter for follow-up examination after completed treatment for conditions other than malignant neoplasm: Secondary | ICD-10-CM

## 2019-08-30 NOTE — Progress Notes (Signed)
    Kayla Tate [birth year] [id number]        59 y.o.  Kayla Tate  RP: Post XI Robotic Supracervical Hysterectomy/BSO/Sacrocolpopexy on 08/16/2019  HPI: Doing very well since surgery with no abdominal pelvic pain.  Incisions well closed.  No vaginal bleeding or discharge.  Urine normal.  Bowel movements within normal but slightly tender when passing stools.  No fever.   OB History  Gravida Para Term Preterm AB Living  2 2       2   SAB TAB Ectopic Multiple Live Births               # Outcome Date GA Lbr Len/2nd Weight Sex Delivery Anes PTL Lv  2 Para           1 Para             Past medical history,surgical history, problem list, medications, allergies, family history and social history were all reviewed and documented in the EPIC chart.   Directed ROS with pertinent positives and negatives documented in the history of present illness/assessment and plan.  Exam:  Vitals:   08/30/19 1132  BP: 118/70   General appearance:  Normal  Abdomen: Soft, not distended, nontender.  All incisions well closed with no erythema or induration.  Gynecologic exam: Vulva normal.  Bimanual exam: Cervix normal and nontender.  No pelvic mass.  Good support of the cervix.  Speculum: Cervix and vagina normal.   Patho 08/16/2019: FINAL MICROSCOPIC DIAGNOSIS:   A. UTERUS AND BILATERAL FALLOPIAN TUBES AND OVARIES, SUPRACERVICAL  HYSTERECTOMY AND BILATERAL SALPINGO-OOPHORECTOMY:  Uterus:  - Endometrium: Attenuated inactive endometrium.  - Myometrium: No significant histopathologic findings.  - Serosa: No significant histopathologic findings.   Adnexa:  - Fallopian tubes: No significant histopathologic findings.  - Ovaries: Inclusion cysts. Corpora albicantia.    Assessment/Plan:  59 y.o. G2P2   1. Status post gynecological surgery, follow-up exam Very good postop healing with no complication.  Precautions reviewed with patient.  We will follow-up in 5 weeks.  Kayla Bruins  MD, 11:38 AM 08/30/2019

## 2019-08-30 NOTE — Patient Instructions (Signed)
1. Status post gynecological surgery, follow-up exam Very good postop healing with no complication.  Precautions reviewed with patient.  We will follow-up in 5 weeks.  Kayla Tate, it was a pleasure seeing you today!

## 2019-10-03 ENCOUNTER — Other Ambulatory Visit: Payer: Self-pay

## 2019-10-04 ENCOUNTER — Encounter: Payer: Self-pay | Admitting: Obstetrics & Gynecology

## 2019-10-04 ENCOUNTER — Ambulatory Visit (INDEPENDENT_AMBULATORY_CARE_PROVIDER_SITE_OTHER): Payer: BC Managed Care – PPO | Admitting: Obstetrics & Gynecology

## 2019-10-04 VITALS — BP 120/76

## 2019-10-04 DIAGNOSIS — Z09 Encounter for follow-up examination after completed treatment for conditions other than malignant neoplasm: Secondary | ICD-10-CM

## 2019-10-04 NOTE — Progress Notes (Signed)
    Kayla Tate 1961/01/26 [id number]        59 y.o.  G2P2L2 Married   RP: Postop XI Robotic Supracervical Laparoscopic Hysterectomy with Bilateral Salpingo-Oophorectomy and Sacrocolpopexy on 08/16/2019  HPI: Doing very well with no abdominopelvic pain.  No vaginal bleeding.  Normal vaginal secretions.  Much more comfortable with good support, no vaginal bulging felt.  Better bladder control with no urinary incontinence even when walking.  BMs normal.  No fever.   OB History  Gravida Para Term Preterm AB Living  2 2       2   SAB TAB Ectopic Multiple Live Births               # Outcome Date GA Lbr Len/2nd Weight Sex Delivery Anes PTL Lv  2 Para           1 Para             Past medical history,surgical history, problem list, medications, allergies, family history and social history were all reviewed and documented in the EPIC chart.   Directed ROS with pertinent positives and negatives documented in the history of present illness/assessment and plan.  Exam:  Vitals:   10/04/19 1225  BP: 120/76   General appearance:  Normal  Abdomen: Soft, NT.  Incisions well healed.  Sutures trimmed.  Gynecologic exam: Vulva normal.  Bimanual exam: Very good support of the cervix with no cystorectocele.  No pelvic mass felt, nontender.   Assessment/Plan:  59 y.o. G2P2   1. Status post gynecological surgery, follow-up exam Excellent postop evolution with good healing and no complication.  Patient satisfied of the support post sacrocolpopexy.  We will follow-up at annual exam gynecologic exam.  Kayla Bruins MD, 12:41 PM 10/04/2019

## 2019-10-10 ENCOUNTER — Encounter: Payer: Self-pay | Admitting: Obstetrics & Gynecology

## 2019-10-10 NOTE — Patient Instructions (Signed)
1. Status post gynecological surgery, follow-up exam Excellent postop evolution with good healing and no complication.  Patient satisfied of the support post sacrocolpopexy.  We will follow-up at annual exam gynecologic exam.  Kayla Tate, it was a pleasure seeing you today!

## 2020-03-07 ENCOUNTER — Ambulatory Visit (INDEPENDENT_AMBULATORY_CARE_PROVIDER_SITE_OTHER): Payer: BC Managed Care – PPO | Admitting: Internal Medicine

## 2020-03-07 ENCOUNTER — Encounter: Payer: Self-pay | Admitting: Internal Medicine

## 2020-03-07 ENCOUNTER — Other Ambulatory Visit: Payer: Self-pay

## 2020-03-07 VITALS — BP 128/72 | HR 55 | Temp 97.8°F | Ht 63.75 in | Wt 135.6 lb

## 2020-03-07 DIAGNOSIS — R413 Other amnesia: Secondary | ICD-10-CM

## 2020-03-07 DIAGNOSIS — G44209 Tension-type headache, unspecified, not intractable: Secondary | ICD-10-CM

## 2020-03-07 DIAGNOSIS — Z0001 Encounter for general adult medical examination with abnormal findings: Secondary | ICD-10-CM

## 2020-03-07 DIAGNOSIS — Z0000 Encounter for general adult medical examination without abnormal findings: Secondary | ICD-10-CM

## 2020-03-07 LAB — COMPREHENSIVE METABOLIC PANEL
ALT: 19 U/L (ref 0–35)
AST: 28 U/L (ref 0–37)
Albumin: 4.3 g/dL (ref 3.5–5.2)
Alkaline Phosphatase: 79 U/L (ref 39–117)
BUN: 11 mg/dL (ref 6–23)
CO2: 30 mEq/L (ref 19–32)
Calcium: 9.6 mg/dL (ref 8.4–10.5)
Chloride: 104 mEq/L (ref 96–112)
Creatinine, Ser: 0.57 mg/dL (ref 0.40–1.20)
GFR: 100.55 mL/min (ref >60.00)
Glucose, Bld: 95 mg/dL (ref 70–99)
Potassium: 4.3 mEq/L (ref 3.5–5.1)
Sodium: 141 mEq/L (ref 135–145)
Total Bilirubin: 0.5 mg/dL (ref 0.2–1.2)
Total Protein: 7.2 g/dL (ref 6.0–8.3)

## 2020-03-07 LAB — CBC WITH DIFFERENTIAL/PLATELET
Basophils Absolute: 0 10*3/uL (ref 0.0–0.1)
Basophils Relative: 0.5 % (ref 0.0–3.0)
Eosinophils Absolute: 0 10*3/uL (ref 0.0–0.7)
Eosinophils Relative: 0.7 % (ref 0.0–5.0)
HCT: 42.5 % (ref 36.0–46.0)
Hemoglobin: 14 g/dL (ref 12.0–15.0)
Lymphocytes Relative: 49.6 % — ABNORMAL HIGH (ref 12.0–46.0)
Lymphs Abs: 2.4 10*3/uL (ref 0.7–4.0)
MCHC: 33 g/dL (ref 30.0–36.0)
MCV: 89.1 fl (ref 78.0–100.0)
Monocytes Absolute: 0.3 10*3/uL (ref 0.1–1.0)
Monocytes Relative: 7 % (ref 3.0–12.0)
Neutro Abs: 2.1 10*3/uL (ref 1.4–7.7)
Neutrophils Relative %: 42.2 % — ABNORMAL LOW (ref 43.0–77.0)
Platelets: 213 10*3/uL (ref 150.0–400.0)
RBC: 4.76 Mil/uL (ref 3.87–5.11)
RDW: 13.9 % (ref 11.5–15.5)
WBC: 4.9 10*3/uL (ref 4.0–10.5)

## 2020-03-07 LAB — URINALYSIS
Bilirubin Urine: NEGATIVE
Hgb urine dipstick: NEGATIVE
Ketones, ur: NEGATIVE
Leukocytes,Ua: NEGATIVE
Nitrite: NEGATIVE
Specific Gravity, Urine: 1.01 (ref 1.000–1.030)
Total Protein, Urine: NEGATIVE
Urine Glucose: NEGATIVE
Urobilinogen, UA: 0.2 (ref 0.0–1.0)
pH: 6 (ref 5.0–8.0)

## 2020-03-07 LAB — LIPID PANEL
Cholesterol: 211 mg/dL — ABNORMAL HIGH (ref 0–200)
HDL: 64.7 mg/dL (ref >39.00)
LDL Cholesterol: 133 mg/dL — ABNORMAL HIGH (ref 0–99)
NonHDL: 145.9
Total CHOL/HDL Ratio: 3
Triglycerides: 66 mg/dL (ref 0.0–149.0)
VLDL: 13.2 mg/dL (ref 0.0–40.0)

## 2020-03-07 LAB — TSH: TSH: 2.51 u[IU]/mL (ref 0.35–4.50)

## 2020-03-07 LAB — VITAMIN B12: Vitamin B-12: 406 pg/mL (ref 211–911)

## 2020-03-07 LAB — VITAMIN D 25 HYDROXY (VIT D DEFICIENCY, FRACTURES): VITD: 31.1 ng/mL (ref 30.00–100.00)

## 2020-03-07 MED ORDER — VITAMIN D3 50 MCG (2000 UT) PO CAPS: 2000.0000 [IU] | ORAL_CAPSULE | Freq: Every day | ORAL | 3 refills | Status: AC

## 2020-03-07 NOTE — Progress Notes (Signed)
Subjective:  Patient ID: Kayla Tate, female    DOB: [birth year]  Age: 59 y.o. MRN: [id number]  CC: Annual Exam   HPI Kayla Tate presents for a well exam  No outpatient medications prior to visit.   No facility-administered medications prior to visit.   Last Sept 1st (2020) -the patient had an acute episode of memory loss and confusion x 30 min.  The patient was working from home.  She went out to work in the yard during her lunch break.  It was very hot.  She came back in half an hour.  She noted that she was disoriented, was unable to login to her computer because she could not recall her password.  She cannot remember the date.  The symptoms lasted for 30 minutes and resolved after she took a shower.  She does not recall if she had a headache.  There was no balance issues, weakness, nausea, vomiting etc. she did not seek any medical help at the time.  She was think about taking aspirin, however, decided against it because she had easy bruising problem in the past.   ROS: Review of Systems  Constitutional: Negative for activity change, appetite change, chills, fatigue and unexpected weight change.  HENT: Negative for congestion, mouth sores and sinus pressure.   Eyes: Negative for visual disturbance.  Respiratory: Negative for cough and chest tightness.   Gastrointestinal: Negative for abdominal pain and nausea.  Genitourinary: Negative for difficulty urinating, frequency and vaginal pain.  Musculoskeletal: Positive for arthralgias. Negative for back pain and gait problem.  Skin: Negative for pallor and rash.  Neurological: Negative for dizziness, tremors, weakness, numbness and headaches.  Psychiatric/Behavioral: Negative for confusion, sleep disturbance and suicidal ideas.    Objective:  BP 128/72 (BP Location: Left Arm)   Pulse (!) 55   Temp 97.8 F (36.6 C) (Oral)   Ht 5' 3.75" (1.619 m)   Wt 135 lb 9.6 oz (61.5 kg)   LMP 02/27/2014 Comment: SUPRACERVICAL  HYSTERECTOMY   SpO2 98%   BMI 23.46 kg/m   BP Readings from Last 3 Encounters:  03/07/20 128/72  10/04/19 120/76  08/30/19 118/70    Wt Readings from Last 3 Encounters:  03/07/20 135 lb 9.6 oz (61.5 kg)  08/16/19 136 lb 12.8 oz (62.1 kg)  08/05/19 137 lb 9 oz (62.4 kg)    Physical Exam Constitutional:      General: She is not in acute distress.    Appearance: She is well-developed.  HENT:     Head: Normocephalic.     Right Ear: External ear normal.     Left Ear: External ear normal.     Nose: Nose normal.  Eyes:     General:        Right eye: No discharge.        Left eye: No discharge.     Conjunctiva/sclera: Conjunctivae normal.     Pupils: Pupils are equal, round, and reactive to light.  Neck:     Thyroid: No thyromegaly.     Vascular: No JVD.     Trachea: No tracheal deviation.  Cardiovascular:     Rate and Rhythm: Normal rate and regular rhythm.     Heart sounds: Normal heart sounds.  Pulmonary:     Effort: No respiratory distress.     Breath sounds: No stridor. No wheezing.  Abdominal:     General: Bowel sounds are normal. There is no distension.     Palpations: Abdomen is soft. There  is no mass.     Tenderness: There is no abdominal tenderness. There is no guarding or rebound.  Musculoskeletal:        General: No tenderness.     Cervical back: Normal range of motion and neck supple.  Lymphadenopathy:     Cervical: No cervical adenopathy.  Skin:    Findings: No erythema or rash.  Neurological:     Cranial Nerves: No cranial nerve deficit.     Motor: No abnormal muscle tone.     Coordination: Coordination normal.     Deep Tendon Reflexes: Reflexes normal.  Psychiatric:        Behavior: Behavior normal.        Thought Content: Thought content normal.        Judgment: Judgment normal.     I spent 22 minutes in addition to time for CPX wellness examination in preparing to see the patient by review of recent labs, imaging and procedures, obtaining and  reviewing separately obtained history, communicating with the patient, ordering medications, tests or procedures, and documenting clinical information in the EHR including the differential diagnosis, treatment, and any further evaluation and other management of global amnesia episode...         Lab Results  Component Value Date   WBC 12.9 (H) 08/16/2019   HGB 13.0 08/16/2019   HCT 41.0 08/16/2019   PLT 253 08/16/2019   GLUCOSE 93 03/30/2018   CHOL 220 (H) 03/30/2018   TRIG 66.0 03/30/2018   HDL 66.00 03/30/2018   LDLDIRECT 140.4 12/06/2010   LDLCALC 141 (H) 03/30/2018   ALT 20 03/30/2018   AST 26 03/30/2018   NA 139 03/30/2018   K 3.7 03/30/2018   CL 102 03/30/2018   CREATININE 0.70 03/30/2018   BUN 11 03/30/2018   CO2 31 03/30/2018   TSH 2.91 03/30/2018    No results found.  Assessment & Plan:   Walker Kehr, MD

## 2020-03-07 NOTE — Assessment & Plan Note (Addendum)
12/2018 - remote Discussed with patient.  Start baby aspirin daily if tolerated.  Hydrate well.   Obtain brain MRI, carotid Doppler ultrasound, lab work

## 2020-03-07 NOTE — Assessment & Plan Note (Signed)
  We discussed age appropriate health related issues, including available/recomended screening tests and vaccinations. Labs were ordered to be later reviewed . All questions were answered. We discussed one or more of the following - seat belt use, use of sunscreen/sun exposure exercise, safe sex, fall risk reduction, second hand smoke exposure, firearm use and storage, seat belt use, a need for adhering to healthy diet and exercise. Labs were ordered.  All questions were answered.  Colon 2015, due in 2020 Opth, GYN, mammo - q 1 year Shingrix 2018 Declined other shots

## 2020-03-09 NOTE — Addendum Note (Signed)
Addended by: Cresenciano Lick on: 03/09/2020 03:58 PM   Modules accepted: Orders

## 2020-03-09 NOTE — Addendum Note (Signed)
Addended by: Cresenciano Lick on: 03/09/2020 05:03 PM   Modules accepted: Orders

## 2020-03-19 ENCOUNTER — Ambulatory Visit (HOSPITAL_COMMUNITY)
Admission: RE | Admit: 2020-03-19 | Discharge: 2020-03-19 | Disposition: A | Discharge status: Home / Self Care | Payer: BC Managed Care – PPO | Source: Ambulatory Visit | Attending: Cardiology | Admitting: Cardiology

## 2020-03-19 ENCOUNTER — Other Ambulatory Visit: Payer: Self-pay

## 2020-03-19 DIAGNOSIS — G44209 Tension-type headache, unspecified, not intractable: Secondary | ICD-10-CM

## 2020-03-19 DIAGNOSIS — R413 Other amnesia: Secondary | ICD-10-CM

## 2020-03-26 ENCOUNTER — Other Ambulatory Visit: Payer: Self-pay

## 2020-03-26 ENCOUNTER — Ambulatory Visit
Admission: RE | Admit: 2020-03-26 | Discharge: 2020-03-26 | Disposition: A | Discharge status: Home / Self Care | Payer: BC Managed Care – PPO | Source: Ambulatory Visit | Attending: Internal Medicine | Admitting: Internal Medicine

## 2020-03-26 DIAGNOSIS — R519 Headache, unspecified: Secondary | ICD-10-CM | POA: Diagnosis not present

## 2020-03-26 DIAGNOSIS — R413 Other amnesia: Secondary | ICD-10-CM

## 2020-03-26 DIAGNOSIS — G44209 Tension-type headache, unspecified, not intractable: Secondary | ICD-10-CM

## 2020-03-26 DIAGNOSIS — H4710 Unspecified papilledema: Secondary | ICD-10-CM | POA: Diagnosis not present

## 2020-03-26 MED ORDER — GADOBENATE DIMEGLUMINE 529 MG/ML IV SOLN
12.0000 mL | Freq: Once | INTRAVENOUS | Status: AC | PRN
  Administered 2020-03-26: 12 mL via INTRAVENOUS

## 2020-08-03 ENCOUNTER — Ambulatory Visit: Payer: BC Managed Care – PPO

## 2020-08-03 IMAGING — MG DIGITAL SCREENING BILAT W/ TOMO W/ CAD
8 series · 8 of 24 positions shown · non-contrast
Comparison: Previous exam(s).

CLINICAL DATA: Screening.

EXAM:
DIGITAL SCREENING BILATERAL MAMMOGRAM WITH TOMO AND CAD

[L CC synth-2D]
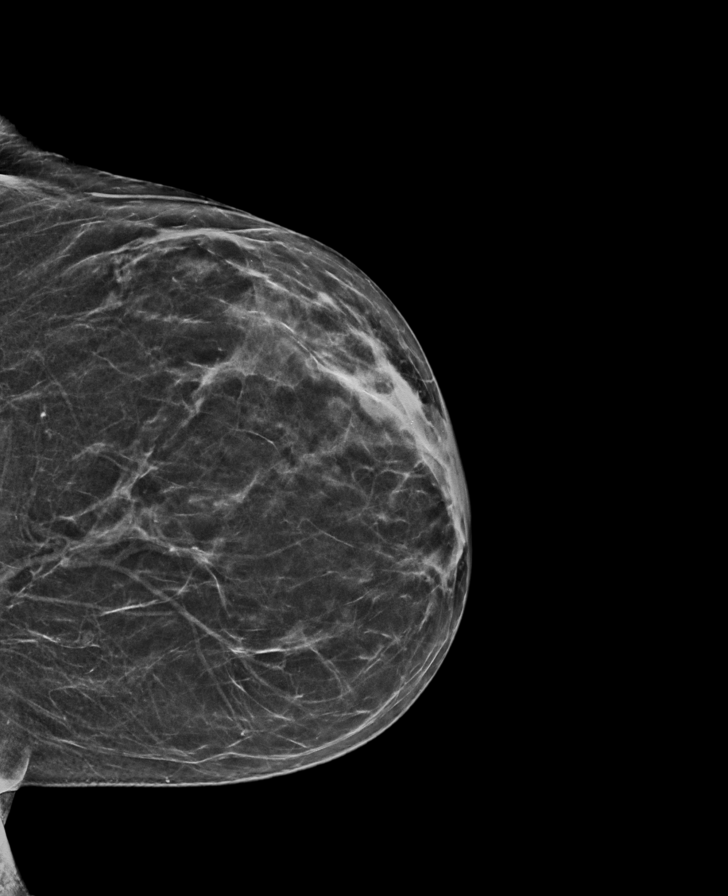

[R MLO synth-2D]
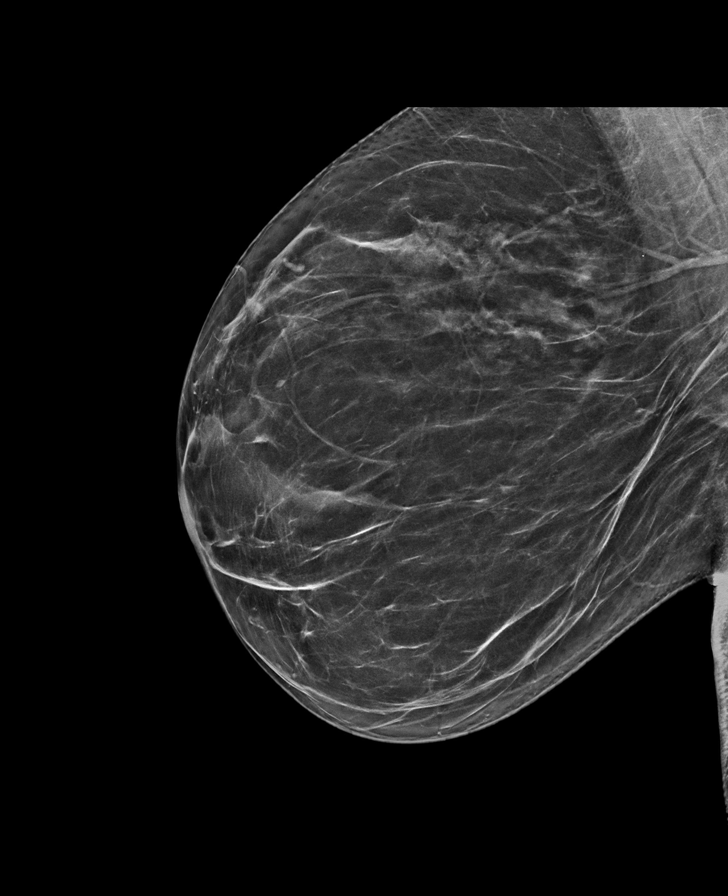

[R CC synth-2D]
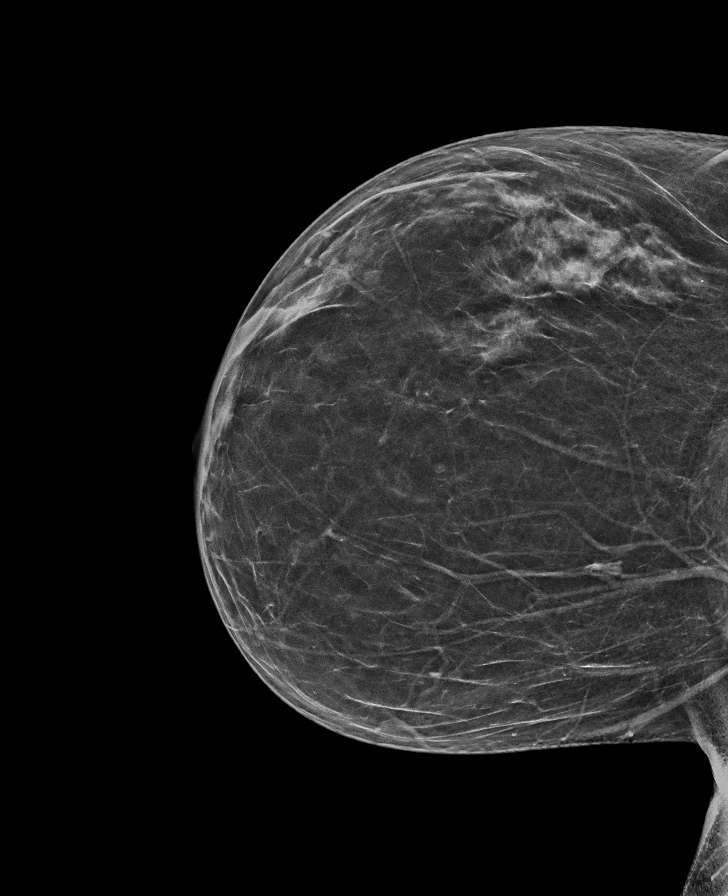

[L MLO synth-2D]
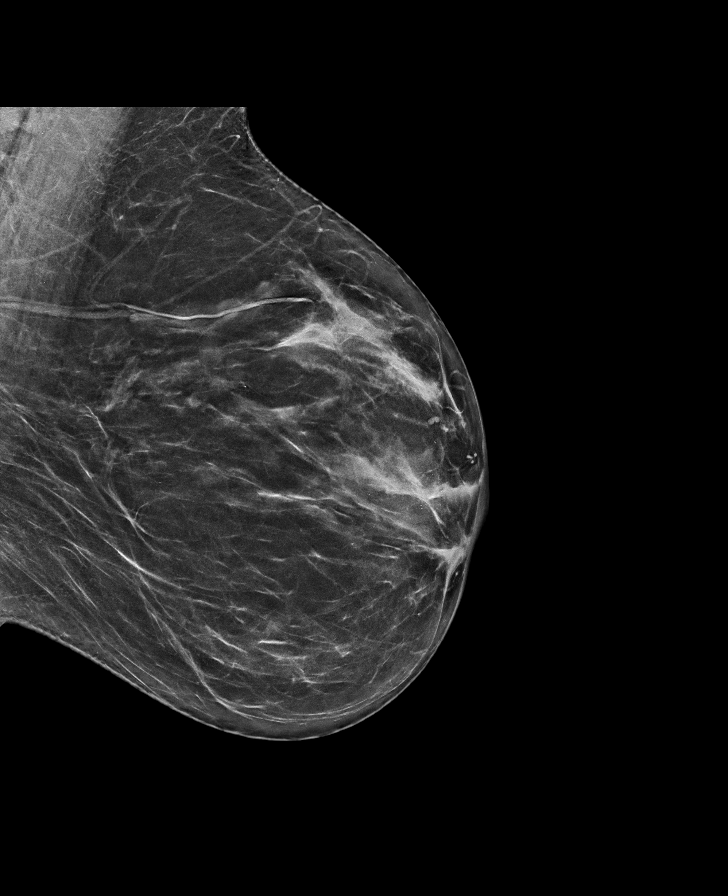

[L CC tomo · tomo slice 30/59.0]
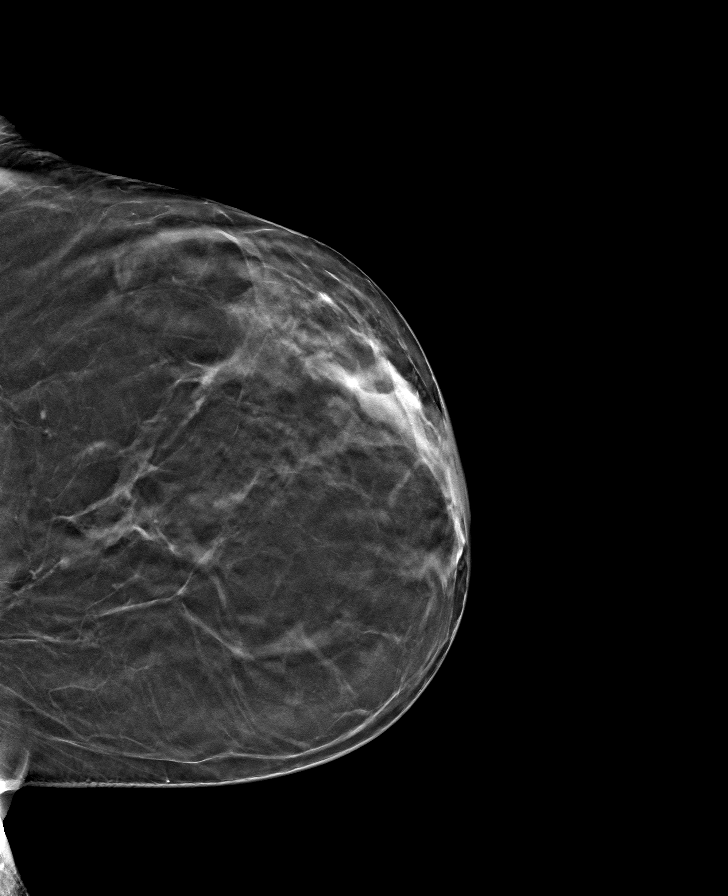

[R CC tomo · tomo slice 33/64.0]
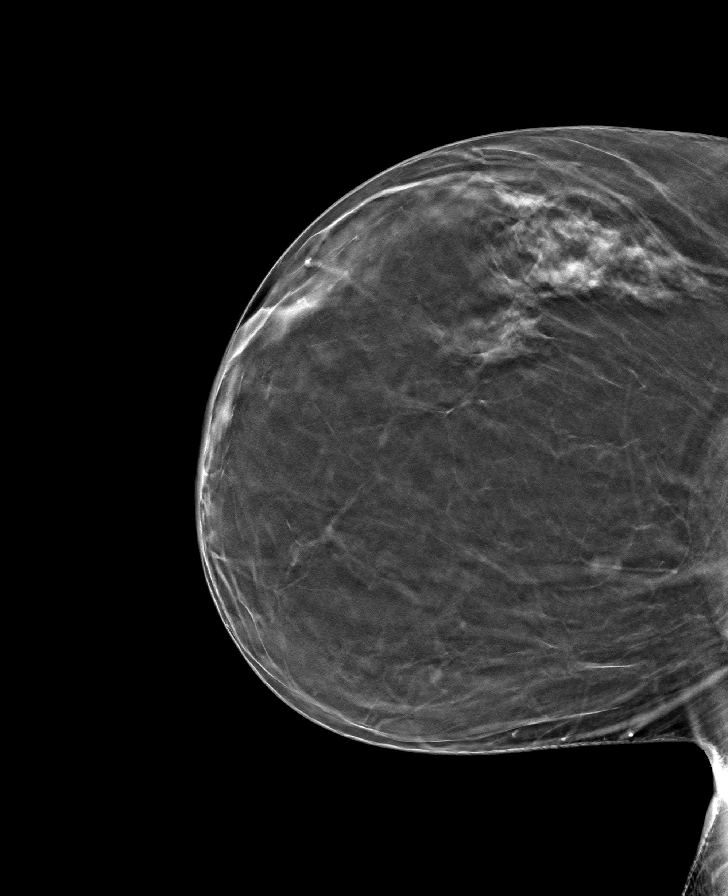

[R MLO tomo · tomo slice 35/68.0]
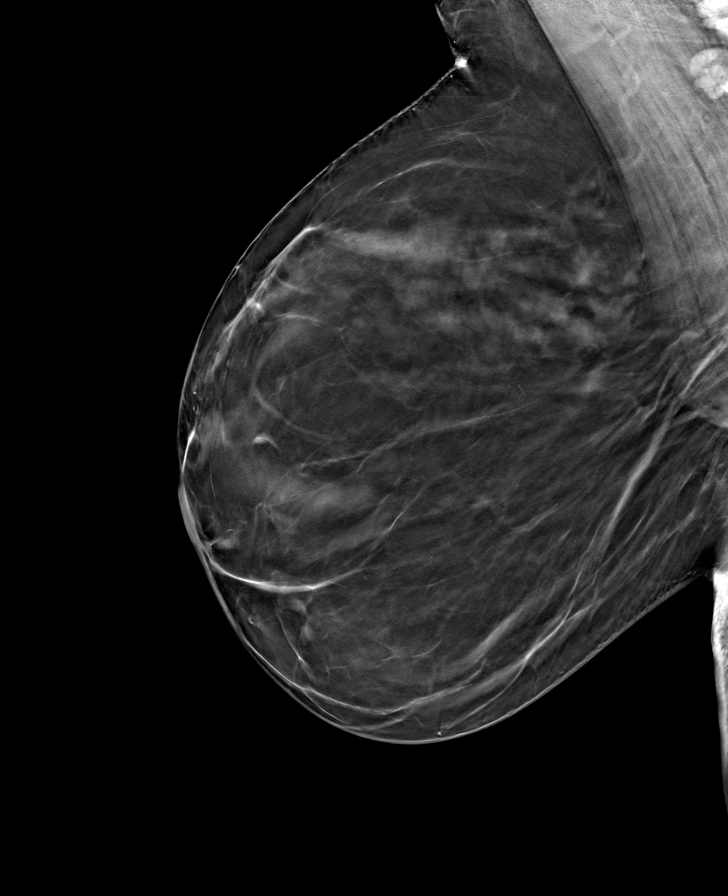

[L MLO tomo · tomo slice 35/68.0]
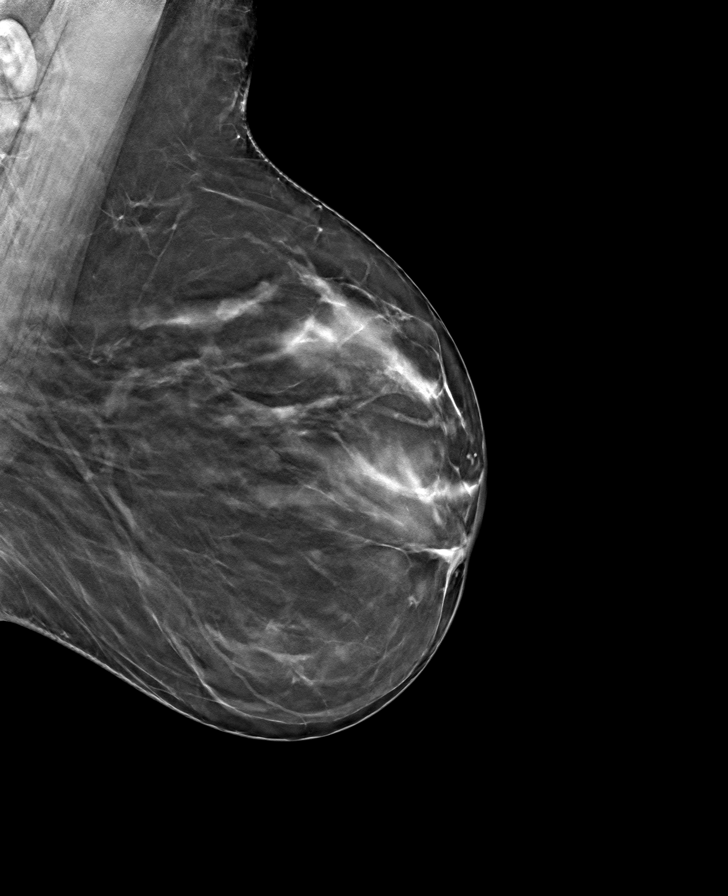

[8 of 24 positions shown; findings below may reference images not displayed]

ACR Breast Density Category b: There are scattered areas of
fibroglandular density.
FINDINGS: There are no findings suspicious for malignancy. Images were
processed with CAD.
IMPRESSION: No mammographic evidence of malignancy. A result letter of this
screening mammogram will be mailed directly to the patient.

RECOMMENDATION:
Screening mammogram in one year. (Code:[id number])

BI-RADS CATEGORY  1: Negative.

## 2020-08-10 ENCOUNTER — Other Ambulatory Visit: Payer: Self-pay

## 2020-08-10 ENCOUNTER — Other Ambulatory Visit (HOSPITAL_BASED_OUTPATIENT_CLINIC_OR_DEPARTMENT_OTHER): Payer: Self-pay

## 2020-08-10 ENCOUNTER — Ambulatory Visit: Payer: BC Managed Care – PPO | Attending: Internal Medicine

## 2020-08-10 DIAGNOSIS — Z23 Encounter for immunization: Secondary | ICD-10-CM

## 2020-08-10 MED ORDER — COVID-19 MRNA VACC (MODERNA) 100 MCG/0.5ML IM SUSP
INTRAMUSCULAR | 0 refills | Status: DC
  Filled 2020-08-10: qty 0.3, 1d supply, fill #0

## 2020-08-10 MED ORDER — COVID-19 MRNA VACC (MODERNA) 100 MCG/0.5ML IM SUSP
INTRAMUSCULAR | 0 refills | Status: DC
  Filled 2020-08-10: qty 0.25, 1d supply, fill #0

## 2020-08-10 NOTE — Progress Notes (Signed)
   Covid-19 Vaccination Clinic  Name:  Kayla Tate    MRN: [id number] DOB: 1960-06-27  08/10/2020  [name] was observed post Covid-19 immunization for 15 minutes without incident. She was provided with Vaccine Information Sheet and instruction to access the V-Safe system.   [name] was instructed to call 911 with any severe reactions post vaccine: Marland Kitchen Difficulty breathing  . Swelling of face and throat  . A fast heartbeat  . A bad rash all over body  . Dizziness and weakness   Immunizations Administered    Name Date Dose VIS Date Route   Moderna Covid-19 Booster Vaccine 08/10/2020 12:26 PM 0.25 mL 02/08/2020 Intramuscular   Manufacturer: [employer]   Lot: [id number]   NDC: 23762-831-51   VOHYWV Comrnaty(Gray TOP) Covid-19 Vaccine 08/10/2020 12:38 PM 0.3 mL 03/29/2020 Intramuscular   Manufacturer: [place]   Lot: [id number]   NDC: [phone]

## 2020-08-14 ENCOUNTER — Ambulatory Visit: Payer: BC Managed Care – PPO

## 2020-11-13 DIAGNOSIS — R21 Rash and other nonspecific skin eruption: Secondary | ICD-10-CM | POA: Diagnosis not present

## 2021-03-22 ENCOUNTER — Encounter: Payer: BC Managed Care – PPO | Admitting: Internal Medicine

## 2021-04-10 ENCOUNTER — Encounter: Payer: BC Managed Care – PPO | Admitting: Internal Medicine

## 2021-04-17 ENCOUNTER — Encounter: Payer: BC Managed Care – PPO | Admitting: Internal Medicine

## 2021-04-21 HISTORY — PX: TOOTH EXTRACTION: SUR596

## 2021-04-23 ENCOUNTER — Ambulatory Visit (INDEPENDENT_AMBULATORY_CARE_PROVIDER_SITE_OTHER): Payer: BC Managed Care – PPO | Admitting: Internal Medicine

## 2021-04-23 ENCOUNTER — Encounter: Payer: Self-pay | Admitting: Internal Medicine

## 2021-04-23 ENCOUNTER — Other Ambulatory Visit: Payer: Self-pay

## 2021-04-23 VITALS — BP 122/74 | HR 81 | Temp 98.1°F | Ht 63.75 in | Wt 136.2 lb

## 2021-04-23 DIAGNOSIS — E559 Vitamin D deficiency, unspecified: Secondary | ICD-10-CM | POA: Diagnosis not present

## 2021-04-23 DIAGNOSIS — N814 Uterovaginal prolapse, unspecified: Secondary | ICD-10-CM | POA: Diagnosis not present

## 2021-04-23 DIAGNOSIS — Z86010 Personal history of colonic polyps: Secondary | ICD-10-CM | POA: Insufficient documentation

## 2021-04-23 DIAGNOSIS — Z0000 Encounter for general adult medical examination without abnormal findings: Secondary | ICD-10-CM | POA: Diagnosis not present

## 2021-04-23 DIAGNOSIS — T63441A Toxic effect of venom of bees, accidental (unintentional), initial encounter: Secondary | ICD-10-CM

## 2021-04-23 LAB — COMPREHENSIVE METABOLIC PANEL
ALT: 22 U/L (ref 0–35)
AST: 34 U/L (ref 0–37)
Albumin: 4.2 g/dL (ref 3.5–5.2)
Alkaline Phosphatase: 72 U/L (ref 39–117)
BUN: 13 mg/dL (ref 6–23)
CO2: 29 mEq/L (ref 19–32)
Calcium: 9.3 mg/dL (ref 8.4–10.5)
Chloride: 102 mEq/L (ref 96–112)
Creatinine, Ser: 0.62 mg/dL (ref 0.40–1.20)
GFR: 97.76 mL/min (ref >60.00)
Glucose, Bld: 82 mg/dL (ref 70–99)
Potassium: 3.6 mEq/L (ref 3.5–5.1)
Sodium: 139 mEq/L (ref 135–145)
Total Bilirubin: 0.7 mg/dL (ref 0.2–1.2)
Total Protein: 6.9 g/dL (ref 6.0–8.3)

## 2021-04-23 LAB — URINALYSIS
Bilirubin Urine: NEGATIVE
Hgb urine dipstick: NEGATIVE
Ketones, ur: NEGATIVE
Leukocytes,Ua: NEGATIVE
Nitrite: NEGATIVE
Specific Gravity, Urine: <=1.005 — AB (ref 1.000–1.030)
Total Protein, Urine: NEGATIVE
Urine Glucose: NEGATIVE
Urobilinogen, UA: 0.2 (ref 0.0–1.0)
pH: 6 (ref 5.0–8.0)

## 2021-04-23 LAB — CBC WITH DIFFERENTIAL/PLATELET
Basophils Absolute: 0 10*3/uL (ref 0.0–0.1)
Basophils Relative: 0.5 % (ref 0.0–3.0)
Eosinophils Absolute: 0.1 10*3/uL (ref 0.0–0.7)
Eosinophils Relative: 1 % (ref 0.0–5.0)
HCT: 39.6 % (ref 36.0–46.0)
Hemoglobin: 12.8 g/dL (ref 12.0–15.0)
Lymphocytes Relative: 50 % — ABNORMAL HIGH (ref 12.0–46.0)
Lymphs Abs: 3 10*3/uL (ref 0.7–4.0)
MCHC: 32.4 g/dL (ref 30.0–36.0)
MCV: 89.7 fl (ref 78.0–100.0)
Monocytes Absolute: 0.5 10*3/uL (ref 0.1–1.0)
Monocytes Relative: 8.1 % (ref 3.0–12.0)
Neutro Abs: 2.4 10*3/uL (ref 1.4–7.7)
Neutrophils Relative %: 40.4 % — ABNORMAL LOW (ref 43.0–77.0)
Platelets: 204 10*3/uL (ref 150.0–400.0)
RBC: 4.41 Mil/uL (ref 3.87–5.11)
RDW: 13.6 % (ref 11.5–15.5)
WBC: 5.9 10*3/uL (ref 4.0–10.5)

## 2021-04-23 LAB — LIPID PANEL
Cholesterol: 226 mg/dL — ABNORMAL HIGH (ref 0–200)
HDL: 65.1 mg/dL (ref >39.00)
LDL Cholesterol: 148 mg/dL — ABNORMAL HIGH (ref 0–99)
NonHDL: 160.71
Total CHOL/HDL Ratio: 3
Triglycerides: 64 mg/dL (ref 0.0–149.0)
VLDL: 12.8 mg/dL (ref 0.0–40.0)

## 2021-04-23 LAB — TSH: TSH: 2.44 u[IU]/mL (ref 0.35–5.50)

## 2021-04-23 NOTE — Assessment & Plan Note (Signed)
°  We discussed age appropriate health related issues, including available/recomended screening tests and vaccinations. Labs were ordered to be later reviewed . All questions were answered. We discussed one or more of the following - seat belt use, use of sunscreen/sun exposure exercise, fall risk reduction, second hand smoke exposure, firearm use and storage, seat belt use, a need for adhering to healthy diet and exercise. Labs were ordered.  All questions were answered.  Colon 2015, due in 2020 Opth, GYN, mammo - q 1 year Shingrix 2018 Declined other shots

## 2021-04-23 NOTE — Assessment & Plan Note (Signed)
GI ref - due colonoscopy

## 2021-04-23 NOTE — Assessment & Plan Note (Addendum)
Dr Dellis Filbert S/p Complete Uterine Prolapse for XI Robotic Supracervical Hysterectomy with BSO and Sacrocolpopexy 4/21

## 2021-04-23 NOTE — Patient Instructions (Signed)
S/p Complete Uterine Prolapse for XI Robotic Supracervical Hysterectomy with BSO and Sacrocolpopexy 4/21

## 2021-04-23 NOTE — Progress Notes (Signed)
Subjective:  Patient ID: Kassie Mends, female    DOB: 05-Feb-1961  Age: 61 y.o. MRN: [id number]  CC: Annual Exam   HPI Allisa Einspahr presents for well exam  S/p Complete Uterine Prolapse for XI Robotic Supracervical Hysterectomy with BSO and Sacrocolpopexy 4/21  Outpatient Medications Prior to Visit  Medication Sig Dispense Refill   Cholecalciferol (VITAMIN D3) 50 MCG (2000 UT) capsule Take 1 capsule (2,000 Units total) by mouth daily. 100 capsule 3   COVID-19 mRNA vaccine, Moderna, 100 MCG/0.5ML injection Inject into the muscle. (Patient not taking: Reported on 04/23/2021) 0.25 mL 0   No facility-administered medications prior to visit.    ROS: Review of Systems  Constitutional:  Negative for activity change, appetite change, chills, fatigue and unexpected weight change.  HENT:  Negative for congestion, mouth sores and sinus pressure.   Eyes:  Negative for visual disturbance.  Respiratory:  Negative for cough and chest tightness.   Gastrointestinal:  Negative for abdominal pain and nausea.  Genitourinary:  Negative for difficulty urinating, frequency and vaginal pain.  Musculoskeletal:  Negative for back pain and gait problem.  Skin:  Negative for pallor and rash.  Neurological:  Negative for dizziness, tremors, weakness, numbness and headaches.  Psychiatric/Behavioral:  Negative for confusion and sleep disturbance.    Objective:  BP 122/74 (BP Location: Left Arm)    Pulse 81    Temp 98.1 F (36.7 C) (Oral)    Ht 5' 3.75" (1.619 m)    Wt 136 lb 3.2 oz (61.8 kg)    LMP 02/27/2014 Comment: SUPRACERVICAL HYSTERECTOMY    SpO2 97%    BMI 23.56 kg/m   BP Readings from Last 3 Encounters:  04/23/21 122/74  03/07/20 128/72  10/04/19 120/76    Wt Readings from Last 3 Encounters:  04/23/21 136 lb 3.2 oz (61.8 kg)  03/07/20 135 lb 9.6 oz (61.5 kg)  08/16/19 136 lb 12.8 oz (62.1 kg)    Physical Exam Constitutional:      General: She is not in acute distress.     Appearance: She is well-developed.  HENT:     Head: Normocephalic.     Right Ear: External ear normal.     Left Ear: External ear normal.     Nose: Nose normal.  Eyes:     General:        Right eye: No discharge.        Left eye: No discharge.     Conjunctiva/sclera: Conjunctivae normal.     Pupils: Pupils are equal, round, and reactive to light.  Neck:     Thyroid: No thyromegaly.     Vascular: No JVD.     Trachea: No tracheal deviation.  Cardiovascular:     Rate and Rhythm: Normal rate and regular rhythm.     Heart sounds: Normal heart sounds.  Pulmonary:     Effort: No respiratory distress.     Breath sounds: No stridor. No wheezing.  Abdominal:     General: Bowel sounds are normal. There is no distension.     Palpations: Abdomen is soft. There is no mass.     Tenderness: There is no abdominal tenderness. There is no guarding or rebound.  Musculoskeletal:        General: No tenderness.     Cervical back: Normal range of motion and neck supple. No rigidity.  Lymphadenopathy:     Cervical: No cervical adenopathy.  Skin:    Findings: No erythema or rash.  Neurological:  Cranial Nerves: No cranial nerve deficit.     Motor: No abnormal muscle tone.     Coordination: Coordination normal.     Deep Tendon Reflexes: Reflexes normal.  Psychiatric:        Behavior: Behavior normal.        Thought Content: Thought content normal.        Judgment: Judgment normal.    Lab Results  Component Value Date   WBC 4.9 03/07/2020   HGB 14.0 03/07/2020   HCT 42.5 03/07/2020   PLT 213.0 03/07/2020   GLUCOSE 95 03/07/2020   CHOL 211 (H) 03/07/2020   TRIG 66.0 03/07/2020   HDL 64.70 03/07/2020   LDLDIRECT 140.4 12/06/2010   LDLCALC 133 (H) 03/07/2020   ALT 19 03/07/2020   AST 28 03/07/2020   NA 141 03/07/2020   K 4.3 03/07/2020   CL 104 03/07/2020   CREATININE 0.57 03/07/2020   BUN 11 03/07/2020   CO2 30 03/07/2020   TSH 2.51 03/07/2020    MR Brain W Wo  Contrast  Result Date: 03/27/2020 CLINICAL DATA:  Headache.  Papilledema.  Memory loss. EXAM: MRI HEAD WITHOUT AND WITH CONTRAST TECHNIQUE: Multiplanar, multiecho pulse sequences of the brain and surrounding structures were obtained without and with intravenous contrast. CONTRAST:  11mL MULTIHANCE GADOBENATE DIMEGLUMINE 529 MG/ML IV SOLN COMPARISON:  None. FINDINGS: Brain: The brain has a normal appearance without evidence of malformation, atrophy, old or acute small or large vessel infarction, mass lesion, hemorrhage, hydrocephalus or extra-axial collection. After contrast administration, no abnormal enhancement occurs. Pituitary gland and sella appear normal. No imaging finding of intracranial hypertension. Vascular: Major vessels at the base of the brain show flow. Venous sinuses appear patent. Skull and upper cervical spine: Normal. Sinuses/Orbits: Clear/normal. Other: None significant. IMPRESSION: Normal examination. No abnormality seen to explain the presenting signs and symptoms. Electronically Signed   By: Nelson Chimes M.D.   On: 03/27/2020 07:54    Assessment & Plan:   Problem List Items Addressed This Visit     Bee sting reaction    GI ref - due colonoscopy      History of colon polyps    GI ref - due colonoscopy      Relevant Orders   Ambulatory referral to Gastroenterology   Uterine prolapse    Dr Dellis Filbert S/p Complete Uterine Prolapse for XI Robotic Supracervical Hysterectomy with BSO and Sacrocolpopexy 4/21      Vitamin D deficiency    On Vit D      Well adult exam - Primary     We discussed age appropriate health related issues, including available/recomended screening tests and vaccinations. Labs were ordered to be later reviewed . All questions were answered. We discussed one or more of the following - seat belt use, use of sunscreen/sun exposure exercise, fall risk reduction, second hand smoke exposure, firearm use and storage, seat belt use, a need for adhering to  healthy diet and exercise. Labs were ordered.  All questions were answered.  Colon 2015, due in 2020 Opth, GYN, mammo - q 1 year Shingrix 2018 Declined other shots      Relevant Orders   TSH   Urinalysis   Lipid panel   Comprehensive metabolic panel   CBC with Differential/Platelet      No orders of the defined types were placed in this encounter.     Follow-up: Return in about 1 year (around 04/23/2022) for Wellness Exam.  Walker Kehr, MD

## 2021-04-23 NOTE — Assessment & Plan Note (Signed)
On Vit D 

## 2021-04-25 ENCOUNTER — Encounter: Payer: Self-pay | Admitting: Internal Medicine

## 2021-04-26 ENCOUNTER — Other Ambulatory Visit: Payer: Self-pay | Admitting: Obstetrics & Gynecology

## 2021-04-26 DIAGNOSIS — Z1231 Encounter for screening mammogram for malignant neoplasm of breast: Secondary | ICD-10-CM

## 2021-04-30 ENCOUNTER — Encounter: Payer: Self-pay | Admitting: Gastroenterology

## 2021-05-09 ENCOUNTER — Other Ambulatory Visit: Payer: Self-pay

## 2021-05-09 ENCOUNTER — Ambulatory Visit
Admission: RE | Admit: 2021-05-09 | Discharge: 2021-05-09 | Disposition: A | Discharge status: Home / Self Care | Payer: BC Managed Care – PPO | Source: Ambulatory Visit | Attending: Obstetrics & Gynecology | Admitting: Obstetrics & Gynecology

## 2021-05-09 DIAGNOSIS — Z1231 Encounter for screening mammogram for malignant neoplasm of breast: Secondary | ICD-10-CM | POA: Diagnosis not present

## 2021-06-14 ENCOUNTER — Ambulatory Visit (AMBULATORY_SURGERY_CENTER): Payer: BC Managed Care – PPO | Admitting: *Deleted

## 2021-06-14 ENCOUNTER — Other Ambulatory Visit: Payer: Self-pay

## 2021-06-14 VITALS — Ht 63.0 in | Wt 138.0 lb

## 2021-06-14 DIAGNOSIS — Z86010 Personal history of colonic polyps: Secondary | ICD-10-CM

## 2021-06-14 MED ORDER — NA SULFATE-K SULFATE-MG SULF 17.5-3.13-1.6 GM/177ML PO SOLN: 1.0000 | Freq: Once | ORAL | 0 refills | Status: AC

## 2021-06-14 NOTE — Progress Notes (Signed)

## 2021-06-17 ENCOUNTER — Encounter: Payer: Self-pay | Admitting: Gastroenterology

## 2021-06-21 ENCOUNTER — Other Ambulatory Visit: Payer: Self-pay

## 2021-06-21 ENCOUNTER — Ambulatory Visit (AMBULATORY_SURGERY_CENTER): Payer: BC Managed Care – PPO | Admitting: Gastroenterology

## 2021-06-21 ENCOUNTER — Encounter: Payer: Self-pay | Admitting: Gastroenterology

## 2021-06-21 VITALS — BP 102/69 | HR 63 | Temp 96.0°F | Resp 14 | Ht 63.75 in | Wt 138.0 lb

## 2021-06-21 DIAGNOSIS — D122 Benign neoplasm of ascending colon: Secondary | ICD-10-CM | POA: Diagnosis not present

## 2021-06-21 DIAGNOSIS — Z86010 Personal history of colonic polyps: Secondary | ICD-10-CM

## 2021-06-21 DIAGNOSIS — Z1211 Encounter for screening for malignant neoplasm of colon: Secondary | ICD-10-CM | POA: Diagnosis not present

## 2021-06-21 MED ORDER — SODIUM CHLORIDE 0.9 % IV SOLN: 500.0000 mL | INTRAVENOUS | Status: DC

## 2021-06-21 NOTE — Progress Notes (Signed)
To Pacu, VSS. Report to [email or web address] 

## 2021-06-21 NOTE — Op Note (Signed)
[place] ?Patient Name: Kayla Tate ?Procedure Date: 06/21/2021 3:49 PM ?MRN: [id number] ?Endoscopist: Mauri Pole , MD ?Age: 61 ?Referring MD:  ?Date of Birth: October 07, 1960 ?Gender: Female ?Account #: [id number] ?Procedure:                Colonoscopy ?Indications:              High risk colon cancer surveillance: Personal  ?                          history of colonic polyps, High risk colon cancer  ?                          surveillance: Personal history of adenoma less than  ?                          10 mm in size ?Medicines:                Monitored Anesthesia Care ?Procedure:                Pre-Anesthesia Assessment: ?                          - Prior to the procedure, a History and Physical  ?                          was performed, and patient medications and  ?                          allergies were reviewed. The patient's tolerance of  ?                          previous anesthesia was also reviewed. The risks  ?                          and benefits of the procedure and the sedation  ?                          options and risks were discussed with the patient.  ?                          All questions were answered, and informed consent  ?                          was obtained. Prior Anticoagulants: The patient has  ?                          taken no previous anticoagulant or antiplatelet  ?                          agents. ASA Grade Assessment: II - A patient with  ?                          mild systemic disease. After reviewing the risks  ?  and benefits, the patient was deemed in  ?                          satisfactory condition to undergo the procedure. ?                          After obtaining informed consent, the colonoscope  ?                          was passed under direct vision. Throughout the  ?                          procedure, the patient's blood pressure, pulse, and  ?                          oxygen saturations were monitored  continuously. The  ?                          Olympus PCF-H190DL (#[id number]) Colonoscope was  ?                          introduced through the anus and advanced to the the  ?                          cecum, identified by appendiceal orifice and  ?                          ileocecal valve. The colonoscopy was performed  ?                          without difficulty. The patient tolerated the  ?                          procedure well. The quality of the bowel  ?                          preparation was excellent. The ileocecal valve,  ?                          appendiceal orifice, and rectum were photographed. ?Scope In: 3:54:35 PM ?Scope Out: 4:10:40 PM ?Scope Withdrawal Time: 0 hours 9 minutes 40 seconds  ?Total Procedure Duration: 0 hours 16 minutes 5 seconds  ?Findings:                 The perianal and digital rectal examinations were  ?                          normal. ?                          A 1 mm polyp was found in the ascending colon. The  ?                          polyp was sessile. The polyp was removed with a  ?  cold biopsy forceps. Resection and retrieval were  ?                          complete. ?                          A few small-mouthed diverticula were found in the  ?                          sigmoid colon. ?                          Non-bleeding external and internal hemorrhoids were  ?                          found during retroflexion. The hemorrhoids were  ?                          small. ?Complications:            No immediate complications. ?Estimated Blood Loss:     Estimated blood loss was minimal. ?Impression:               - One 1 mm polyp in the ascending colon, removed  ?                          with a cold biopsy forceps. Resected and retrieved. ?                          - Diverticulosis in the sigmoid colon. ?                          - Non-bleeding external and internal hemorrhoids. ?Recommendation:           - Patient has a contact number available  for  ?                          emergencies. The signs and symptoms of potential  ?                          delayed complications were discussed with the  ?                          patient. Return to normal activities tomorrow.  ?                          Written discharge instructions were provided to the  ?                          patient. ?                          - Resume previous diet. ?                          - Continue present medications. ?                          -  Await pathology results. ?                          - Repeat colonoscopy in 5-10 years for surveillance  ?                          based on pathology results. ?Mauri Pole, MD ?06/21/2021 4:16:36 PM ?This report has been signed electronically. ?

## 2021-06-21 NOTE — Progress Notes (Signed)
[facility] History and Physical ? ? ?Primary Care Physician:  Cassandria Anger, MD ? ? ?Reason for Procedure:  History of adenomatous colon polyps ? ?Plan:    Surveillance colonoscopy with possible interventions as needed ? ? ? ? ?HPI: Kayla Tate is a very pleasant 61 y.o. female here for surveillance colonoscopy. ?Denies any nausea, vomiting, abdominal pain, melena or bright red blood per rectum ? ?The risks and benefits as well as alternatives of endoscopic procedure(s) have been discussed and reviewed. All questions answered. The patient agrees to proceed. ? ? ? ?Past Medical History:  ?Diagnosis Date  ? Anemia   ? iron def  ? Blood transfusion without reported diagnosis   ? "2 YEARS AGO"  ? Wrist fracture, right 2007  ? ? ?Past Surgical History:  ?Procedure Laterality Date  ? ABDOMINAL HYSTERECTOMY    ? BIOPSY BREAST  1998  ? COLONOSCOPY    ? KNEE LIGAMENT RECONSTRUCTION  1985  ? Left  ? MOUTH SURGERY    ? POLYPECTOMY    ? TOOTH EXTRACTION  2023  ? XI ROBOTIC ASSISTED TOTAL HYSTERECTOMY WITH SACROCOLPOPEXY N/A 08/16/2019  ? Procedure: XI ROBOTIC ASSISTED SUPRACERVICAL HYSTERECTOMY WITH BILATERAL SALPINGO OOPHERECTOMY AND SACROCOLPOPEXY;  Surgeon: Princess Bruins, MD;  Location: [facility];  Service: Gynecology;  Laterality: N/A;  ? ? ?Prior to Admission medications   ?Medication Sig Start Date End Date Taking? Authorizing Provider  ?amoxicillin (AMOXIL) 500 MG capsule Take 500 mg by mouth 3 (three) times daily. 06/18/21  Yes [provider]  ?Cholecalciferol (VITAMIN D3) 50 MCG (2000 UT) capsule Take 1 capsule (2,000 Units total) by mouth daily. 03/07/20  Yes Plotnikov, Evie Lacks, MD  ? ? ?Current Outpatient Medications  ?Medication Sig Dispense Refill  ? amoxicillin (AMOXIL) 500 MG capsule Take 500 mg by mouth 3 (three) times daily.    ? Cholecalciferol (VITAMIN D3) 50 MCG (2000 UT) capsule Take 1 capsule (2,000 Units total) by mouth daily. 100 capsule 3   ? ?Current Facility-Administered Medications  ?Medication Dose Route Frequency Provider Last Rate Last Admin  ? 0.9 %  sodium chloride infusion  500 mL Intravenous Continuous Isaack Preble, Venia Minks, MD      ? ? ?Allergies as of 06/21/2021 - Review Complete 06/21/2021  ?Allergen Reaction Noted  ? Shingrix [zoster vac recomb adjuvanted]  09/09/2016  ? Varicella-zoster immune glob Rash 09/09/2016  ? ? ?Family History  ?Problem Relation Age of Onset  ? Heart disease Mother 80  ?     CAD  ? Hypertension Mother 44  ?     CAD  ? Pneumonia Father   ? Colon cancer Neg Hx   ? Esophageal cancer Neg Hx   ? Stomach cancer Neg Hx   ? Rectal cancer Neg Hx   ? Colon polyps Neg Hx   ? ? ?Social History  ? ?Socioeconomic History  ? Marital status: Married  ?  Spouse name: Not on file  ? Number of children: 2  ? Years of education: Not on file  ? Highest education level: Not on file  ?Occupational History  ? Occupation: accountant  ?Tobacco Use  ? Smoking status: Never  ?  Passive exposure: Never  ? Smokeless tobacco: Never  ?Vaping Use  ? Vaping Use: Never used  ?Substance and Sexual Activity  ? Alcohol use: No  ?  Alcohol/week: 0.0 standard drinks  ? Drug use: No  ? Sexual activity: Not Currently  ?Other Topics Concern  ? Not on  file  ?Social History Narrative  ? H/o parachute sports  ? ?Social Determinants of Health  ? ?Financial Resource Strain: Not on file  ?Food Insecurity: Not on file  ?Transportation Needs: Not on file  ?Physical Activity: Not on file  ?Stress: Not on file  ?Social Connections: Not on file  ?Intimate Partner Violence: Not on file  ? ? ?Review of Systems: ? ?All other review of systems negative except as mentioned in the HPI. ? ?Physical Exam: ?Vital signs in last 24 hours: ?BP 115/71   Pulse 70   Temp (!) 96 ?F (35.6 ?C) (Temporal)   Ht 5' 3.75" (1.619 m)   Wt 138 lb (62.6 kg)   LMP 02/27/2014 Comment: SUPRACERVICAL HYSTERECTOMY   SpO2 100%   BMI 23.87 kg/m?  ?General:   Alert, NAD ?Lungs:  Clear .    ?Heart:  Regular rate and rhythm ?Abdomen:  Soft, nontender and nondistended. ?Neuro/Psych:  Alert and cooperative. Normal mood and affect. A and O x 3 ? ?Reviewed labs, radiology imaging, old records and pertinent past GI work up ? ?Patient is appropriate for planned procedure(s) and anesthesia in an ambulatory setting ? ? ?K. Denzil Magnuson , MD ?[phone]  ? ? ?  ?

## 2021-06-21 NOTE — Progress Notes (Signed)
Called to room to assist during endoscopic procedure.  Patient ID and intended procedure confirmed with present staff. Received instructions for my participation in the procedure from the performing physician.  

## 2021-06-21 NOTE — Patient Instructions (Signed)
Handout on polyps and diverticulosis given    YOU HAD AN ENDOSCOPIC PROCEDURE TODAY AT THE [facility]:   Refer to the procedure report that was given to you for any specific questions about what was found during the examination.  If the procedure report does not answer your questions, please call your gastroenterologist to clarify.  If you requested that your care partner not be given the details of your procedure findings, then the procedure report has been included in a sealed envelope for you to review at your convenience later.  YOU SHOULD EXPECT: Some feelings of bloating in the abdomen. Passage of more gas than usual.  Walking can help get rid of the air that was put into your GI tract during the procedure and reduce the bloating. If you had a lower endoscopy (such as a colonoscopy or flexible sigmoidoscopy) you may notice spotting of blood in your stool or on the toilet paper. If you underwent a bowel prep for your procedure, you may not have a normal bowel movement for a few days.  Please Note:  You might notice some irritation and congestion in your nose or some drainage.  This is from the oxygen used during your procedure.  There is no need for concern and it should clear up in a day or so.  SYMPTOMS TO REPORT IMMEDIATELY:   Following lower endoscopy (colonoscopy or flexible sigmoidoscopy):  Excessive amounts of blood in the stool  Significant tenderness or worsening of abdominal pains  Swelling of the abdomen that is new, acute  Fever of 100F or higher   For urgent or emergent issues, a gastroenterologist can be reached at any hour by calling [phone]. Do not use MyChart messaging for urgent concerns.    DIET:  We do recommend a small meal at first, but then you may proceed to your regular diet.  Drink plenty of fluids but you should avoid alcoholic beverages for 24 hours.  ACTIVITY:  You should plan to take it easy for the rest of today and you should NOT  DRIVE or use heavy machinery until tomorrow (because of the sedation medicines used during the test).    FOLLOW UP: Our staff will call the number listed on your records 48-72 hours following your procedure to check on you and address any questions or concerns that you may have regarding the information given to you following your procedure. If we do not reach you, we will leave a message.  We will attempt to reach you two times.  During this call, we will ask if you have developed any symptoms of COVID 19. If you develop any symptoms (ie: fever, flu-like symptoms, shortness of breath, cough etc.) before then, please call [phone].  If you test positive for Covid 19 in the 2 weeks post procedure, please call and report this information to us.    If any biopsies were taken you will be contacted by phone or by letter within the next 1-3 weeks.  Please call us at [phone] if you have not heard about the biopsies in 3 weeks.    SIGNATURES/CONFIDENTIALITY: You and/or your care partner have signed paperwork which will be entered into your electronic medical record.  These signatures attest to the fact that that the information above on your After Visit Summary has been reviewed and is understood.  Full responsibility of the confidentiality of this discharge information lies with you and/or your care-partner. 

## 2021-06-21 NOTE — Progress Notes (Signed)
Pt's states no medical or surgical changes since previsit or office visit. 

## 2021-06-25 ENCOUNTER — Telehealth: Payer: Self-pay

## 2021-06-25 NOTE — Telephone Encounter (Signed)
No answer, left message to call back later today, B.Yukio Bisping RN. 

## 2021-06-25 NOTE — Telephone Encounter (Signed)
?  Follow up Call- ? ?Call back number 06/21/2021  ?Post procedure Call Back phone  # [phone]  ?Permission to leave phone message Yes  ?Some recent data might be hidden  ?  ? ?Patient questions: ? ?Do you have a fever, pain , or abdominal swelling? No. ?Pain Score  0 * ? ?Have you tolerated food without any problems? Yes.   ? ?Have you been able to return to your normal activities? Yes.   ? ?Do you have any questions about your discharge instructions: ?Diet   No. ?Medications  No. ?Follow up visit  No. ? ?Do you have questions or concerns about your Care? No. ? ?Actions: ?* If pain score is 4 or above: ?No action needed, pain <4. ? ?Have you developed a fever since your procedure? no ? ?2.   Have you had an respiratory symptoms (SOB or cough) since your procedure? no ? ?3.   Have you tested positive for COVID 19 since your procedure no ? ?4.   Have you had any family members/close contacts diagnosed with the COVID 19 since your procedure?  no ? ? ?If yes to any of these questions please route to Joylene John, RN and Joella Prince, RN ? ?

## 2021-06-27 ENCOUNTER — Encounter: Payer: Self-pay | Admitting: Gastroenterology

## 2021-07-02 ENCOUNTER — Encounter: Payer: Self-pay | Admitting: Internal Medicine

## 2021-07-04 ENCOUNTER — Telehealth (INDEPENDENT_AMBULATORY_CARE_PROVIDER_SITE_OTHER): Payer: BC Managed Care – PPO | Admitting: Nurse Practitioner

## 2021-07-04 ENCOUNTER — Other Ambulatory Visit: Payer: Self-pay

## 2021-07-04 ENCOUNTER — Encounter: Payer: Self-pay | Admitting: Nurse Practitioner

## 2021-07-04 DIAGNOSIS — U071 COVID-19: Secondary | ICD-10-CM

## 2021-07-04 MED ORDER — NIRMATRELVIR/RITONAVIR (PAXLOVID)TABLET: 3.0000 | ORAL_TABLET | Freq: Two times a day (BID) | ORAL | 0 refills | Status: AC

## 2021-07-04 MED ORDER — FLUTICASONE PROPIONATE 50 MCG/ACT NA SUSP: 2.0000 | Freq: Every day | NASAL | 6 refills | Status: DC

## 2021-07-04 MED ORDER — BENZONATATE 100 MG PO CAPS: 100.0000 mg | ORAL_CAPSULE | Freq: Two times a day (BID) | ORAL | 0 refills | Status: DC | PRN

## 2021-07-04 NOTE — Progress Notes (Signed)
? ? ?An audio-only tele-health visit was completed today for this patient. I connected with  Kayla Tate on 07/04/21 utilizing audio-only technology and verified that I am speaking with the correct person using two identifiers. The patient was located at their home, and I was located at the office of [place] at [facility] during the encounter. I discussed the limitations of evaluation and management by telemedicine. The patient expressed understanding and agreed to proceed.  ? ? ?Subjective:  ?Patient ID: Kayla Tate, female    DOB: 07/17/1960  Age: 61 y.o. MRN: [id number] ? ?CC:  ?Chief Complaint  ?Patient presents with  ? Covid Positive  ?  ? ? ?HPI  ?This patient arrives today for virtual visit for the above. ? ?She reports that she started experiencing upper respiratory infection symptoms about 4 days ago.  She tells me her husband came home from a trip and he started to feel sick and was tested for COVID and he tested positive.  She tells me the next day she started to feel sick and tested positive for COVID-19 as well.  She currently is experiencing cough and intermittent fevers.  She is not experiencing shortness of breath but does have headache, nasal congestion, and rhinorrhea.  She has taken ibuprofen with some improvement in her fever.  Her last dose of ibuprofen was about 2 days ago.  She has been vaccinated against COVID-19 and per chart review looks like she is had 4 vaccines. ? ?Past Medical History:  ?Diagnosis Date  ? Anemia   ? iron def  ? Blood transfusion without reported diagnosis   ? "2 YEARS AGO"  ? Wrist fracture, right 2007  ? ? ? ? ?Family History  ?Problem Relation Age of Onset  ? Heart disease Mother 37  ?     CAD  ? Hypertension Mother 61  ?     CAD  ? Pneumonia Father   ? Colon cancer Neg Hx   ? Esophageal cancer Neg Hx   ? Stomach cancer Neg Hx   ? Rectal cancer Neg Hx   ? Colon polyps Neg Hx   ? ? ?Social History  ? ?Social History Narrative  ? H/o parachute  sports  ? ?Social History  ? ?Tobacco Use  ? Smoking status: Never  ?  Passive exposure: Never  ? Smokeless tobacco: Never  ?Substance Use Topics  ? Alcohol use: No  ?  Alcohol/week: 0.0 standard drinks  ? ? ? ?Current Meds  ?Medication Sig  ? benzonatate (TESSALON) 100 MG capsule Take 1 capsule (100 mg total) by mouth 2 (two) times daily as needed for cough.  ? Cholecalciferol (VITAMIN D3) 50 MCG (2000 UT) capsule Take 1 capsule (2,000 Units total) by mouth daily.  ? fluticasone (FLONASE) 50 MCG/ACT nasal spray Place 2 sprays into both nostrils daily.  ? ibuprofen (ADVIL) 200 MG tablet Take 200 mg by mouth every 6 (six) hours as needed.  ? nirmatrelvir/ritonavir EUA (PAXLOVID) 20 x 150 MG & 10 x '100MG'$  TABS Take 3 tablets by mouth 2 (two) times daily for 5 days. (Take nirmatrelvir 150 mg two tablets twice daily for 5 days and ritonavir 100 mg one tablet twice daily for 5 days) Patient GFR is 97  ? ? ?ROS:  ?Review of Systems  ?Constitutional:  Positive for fever (101, now improving).  ?HENT:  Positive for congestion (and rhinorrhea).   ?Respiratory:  Positive for cough. Negative for shortness of breath and wheezing.   ?Gastrointestinal:  Negative for abdominal pain, diarrhea and nausea.  ?Musculoskeletal:  Negative for myalgias.  ?Neurological:  Positive for headaches.  ? ? ?Objective:  ? ?Today's Vitals: LMP 02/27/2014 Comment: SUPRACERVICAL HYSTERECTOMY  ?Vitals with BMI 06/21/2021 06/21/2021 06/21/2021  ?Height - - -  ?Weight - - -  ?BMI - - -  ?Systolic [phone]  ?Diastolic 69 69 66  ?Pulse 63 62 68  ?  ? ?Physical Exam ?Comprehensive physical exam not completed today as office visit was conducted remotely.  Patient sounded fairly well over the phone, she did cough during our visit.  She did not sound like she was in respiratory distress that she was able to speak in complete sentences without having to stop to breathe.  Patient was alert and oriented, and appeared to have appropriate  judgment. ? ? ? ? ? ? ?Assessment and Plan  ? ?1. COVID-19   ? ? ? ?Plan: ?See plan via problem list below.  Total time spent the phone today was 11 minutes and 4 seconds. ? ? ? ?Tests ordered ?No orders of the defined types were placed in this encounter. ? ? ? ? ?Meds ordered this encounter  ?Medications  ? fluticasone (FLONASE) 50 MCG/ACT nasal spray  ?  Sig: Place 2 sprays into both nostrils daily.  ?  Dispense:  16 g  ?  Refill:  6  ?  Order Specific Question:   Supervising Provider  ?  Answer:   Binnie Rail [[id number]]  ? benzonatate (TESSALON) 100 MG capsule  ?  Sig: Take 1 capsule (100 mg total) by mouth 2 (two) times daily as needed for cough.  ?  Dispense:  20 capsule  ?  Refill:  0  ?  Order Specific Question:   Supervising Provider  ?  Answer:   Binnie Rail [[id number]]  ? nirmatrelvir/ritonavir EUA (PAXLOVID) 20 x 150 MG & 10 x '100MG'$  TABS  ?  Sig: Take 3 tablets by mouth 2 (two) times daily for 5 days. (Take nirmatrelvir 150 mg two tablets twice daily for 5 days and ritonavir 100 mg one tablet twice daily for 5 days) Patient GFR is 97  ?  Dispense:  30 tablet  ?  Refill:  0  ?  Order Specific Question:   Supervising Provider  ?  Answer:   Binnie Rail [[id number]]  ? ? ?Patient to follow-up as needed ? ?Ailene Ards, NP ? ?

## 2021-07-04 NOTE — Assessment & Plan Note (Signed)
Per shared decision making patient would like to try antiviral treatment.  We will prescribe Paxlovid as GFR is greater than 60.  We will also treat her symptoms with Flonase nasal spray and as needed Tessalon Perles for cough suppression.  She was told to call 911 if symptoms worsen over the weekend especially if she experiences shortness of breath.  She was also told to call our office next week if she still feels unwell for further evaluation.  We did discuss isolating until this upcoming Sunday, and to make sure she wears a mask if she were to go out in public for 5 days after ending her isolation.  She reports her understanding. ?

## 2021-07-09 DIAGNOSIS — M25562 Pain in left knee: Secondary | ICD-10-CM | POA: Diagnosis not present

## 2021-07-09 DIAGNOSIS — M25561 Pain in right knee: Secondary | ICD-10-CM | POA: Diagnosis not present

## 2021-07-22 ENCOUNTER — Ambulatory Visit: Payer: BC Managed Care – PPO | Admitting: Obstetrics & Gynecology

## 2021-07-29 DIAGNOSIS — M25561 Pain in right knee: Secondary | ICD-10-CM | POA: Diagnosis not present

## 2021-08-02 DIAGNOSIS — M25561 Pain in right knee: Secondary | ICD-10-CM | POA: Diagnosis not present

## 2021-08-05 ENCOUNTER — Ambulatory Visit: Payer: BC Managed Care – PPO | Admitting: Obstetrics & Gynecology

## 2021-08-09 ENCOUNTER — Encounter: Payer: Self-pay | Admitting: Obstetrics & Gynecology

## 2021-08-09 ENCOUNTER — Ambulatory Visit (INDEPENDENT_AMBULATORY_CARE_PROVIDER_SITE_OTHER): Payer: BC Managed Care – PPO | Admitting: Obstetrics & Gynecology

## 2021-08-09 VITALS — BP 118/64 | HR 72 | Resp 16 | Ht 63.25 in | Wt 131.0 lb

## 2021-08-09 DIAGNOSIS — Z780 Asymptomatic menopausal state: Secondary | ICD-10-CM | POA: Diagnosis not present

## 2021-08-09 DIAGNOSIS — Z90711 Acquired absence of uterus with remaining cervical stump: Secondary | ICD-10-CM

## 2021-08-09 DIAGNOSIS — Z01419 Encounter for gynecological examination (general) (routine) without abnormal findings: Secondary | ICD-10-CM | POA: Diagnosis not present

## 2021-08-09 NOTE — Progress Notes (Signed)
? ? ?Riann Oman July 11, 1960 [id number] ? ? ?History:    61 y.o. G2P2L2 Married.  Daughter in Automotive engineer. ?  ?RP:  Established patient presenting for annual gyn exam  ?  ?HPI: S/P XI Robotic Supracervical Laparoscopic Hysterectomy with Bilateral Salpingo-Oophorectomy and Sacrocolpopexy on 08/16/2019.  Patho benign.  Repeat Pap at 3 years next year.  Postmenopause, well on no hormone replacement therapy.  No pelvic pain.  Urine normal.  Bowel movements normal.  Breast normal. Mammo Neg 04/2021.  Body mass index 23.02.  Good fitness. Healthy nutrition.  Health labs with family physician. COLONOSCOPY: 06-21-21. ?  ?Past medical history,surgical history, family history and social history were all reviewed and documented in the EPIC chart. ? ?Gynecologic History ?Patient's last menstrual period was 02/27/2014. ? ?Obstetric History ?OB History  ?Gravida Para Term Preterm AB Living  ?'2 2       2  '$ ?SAB IAB Ectopic Multiple Live Births  ?           ?  ?# Outcome Date GA Lbr Len/2nd Weight Sex Delivery Anes PTL Lv  ?2 Para           ?1 Para           ? ? ? ?ROS: A ROS was performed and pertinent positives and negatives are included in the history. ? GENERAL: No fevers or chills. HEENT: No change in vision, no earache, sore throat or sinus congestion. NECK: No pain or stiffness. CARDIOVASCULAR: No chest pain or pressure. No palpitations. PULMONARY: No shortness of breath, cough or wheeze. GASTROINTESTINAL: No abdominal pain, nausea, vomiting or diarrhea, melena or bright red blood per rectum. GENITOURINARY: No urinary frequency, urgency, hesitancy or dysuria. MUSCULOSKELETAL: No joint or muscle pain, no back pain, no recent trauma. DERMATOLOGIC: No rash, no itching, no lesions. ENDOCRINE: No polyuria, polydipsia, no heat or cold intolerance. No recent change in weight. HEMATOLOGICAL: No anemia or easy bruising or bleeding. NEUROLOGIC: No headache, seizures, numbness, tingling or weakness. PSYCHIATRIC: No  depression, no loss of interest in normal activity or change in sleep pattern.  ?  ? ?Exam: ? ? ?BP 118/64   Pulse 72   Resp 16   Ht 5' 3.25" (1.607 m)   Wt 131 lb (59.4 kg)   LMP 02/27/2014 Comment: SUPRACERVICAL HYSTERECTOMY   BMI 23.02 kg/m?  ? ?Body mass index is 23.02 kg/m?. ? ?General appearance : Well developed well nourished female. No acute distress ?HEENT: Eyes: no retinal hemorrhage or exudates,  Neck supple, trachea midline, no carotid bruits, no thyroidmegaly ?Lungs: Clear to auscultation, no rhonchi or wheezes, or rib retractions  ?Heart: Regular rate and rhythm, no murmurs or gallops ?Breast:Examined in sitting and supine position were symmetrical in appearance, no palpable masses or tenderness,  no skin retraction, no nipple inversion, no nipple discharge, no skin discoloration, no axillary or supraclavicular lymphadenopathy ?Abdomen: no palpable masses or tenderness, no rebound or guarding ?Extremities: no edema or skin discoloration or tenderness ? ?Pelvic: Vulva: Normal ?            Vagina: No gross lesions or discharge ? Cervix: No gross lesions or discharge ? Uterus Absent ? Adnexa  Without masses or tenderness ? Anus: Normal ? ? ?Assessment/Plan:  61 y.o. female for annual exam  ? ?1. Well female exam with routine gynecological exam ?S/P XI Robotic Supracervical Laparoscopic Hysterectomy with Bilateral Salpingo-Oophorectomy and Sacrocolpopexy on 08/16/2019.  Patho benign.  Repeat Pap at 3 years next year.  Postmenopause, well on  no hormone replacement therapy.  No pelvic pain. No pain with IC. Urine normal.  Bowel movements normal.  Breast normal. Mammo Neg 04/2021.  Body mass index 23.02.  Good fitness. Healthy nutrition.  Health labs with family physician. COLONOSCOPY: 06-21-21. ? ?2. S/P Robotic laparoscopic supracervical hysterectomy with Sacrocolpopexy ? ?3. Postmenopause  ?S/P XI Robotic Supracervical Laparoscopic Hysterectomy with Bilateral Salpingo-Oophorectomy and Sacrocolpopexy on  08/16/2019.  Patho benign.  Postmenopause, well on no hormone replacement therapy.  No pelvic pain.  No pain with IC. ? ?Princess Bruins MD, 12:11 PM 08/09/2021 ? ?  ?

## 2022-06-05 ENCOUNTER — Encounter: Payer: Self-pay | Admitting: Internal Medicine

## 2022-06-05 ENCOUNTER — Ambulatory Visit (INDEPENDENT_AMBULATORY_CARE_PROVIDER_SITE_OTHER): Payer: BC Managed Care – PPO | Admitting: Internal Medicine

## 2022-06-05 VITALS — BP 134/80 | HR 65 | Temp 97.6°F | Ht 63.25 in | Wt 131.0 lb

## 2022-06-05 DIAGNOSIS — Z23 Encounter for immunization: Secondary | ICD-10-CM

## 2022-06-05 DIAGNOSIS — Z0000 Encounter for general adult medical examination without abnormal findings: Secondary | ICD-10-CM | POA: Diagnosis not present

## 2022-06-05 DIAGNOSIS — M25561 Pain in right knee: Secondary | ICD-10-CM

## 2022-06-05 DIAGNOSIS — G8929 Other chronic pain: Secondary | ICD-10-CM

## 2022-06-05 DIAGNOSIS — M25562 Pain in left knee: Secondary | ICD-10-CM

## 2022-06-05 LAB — URINALYSIS, ROUTINE W REFLEX MICROSCOPIC
Bilirubin Urine: NEGATIVE
Hgb urine dipstick: NEGATIVE
Ketones, ur: NEGATIVE
Nitrite: NEGATIVE
Specific Gravity, Urine: <=1.005 — AB (ref 1.000–1.030)
Total Protein, Urine: NEGATIVE
Urine Glucose: NEGATIVE
Urobilinogen, UA: 0.2 (ref 0.0–1.0)
pH: 6 (ref 5.0–8.0)

## 2022-06-05 LAB — CBC WITH DIFFERENTIAL/PLATELET
Basophils Absolute: 0 10*3/uL (ref 0.0–0.1)
Basophils Relative: 0.6 % (ref 0.0–3.0)
Eosinophils Absolute: 0.1 10*3/uL (ref 0.0–0.7)
Eosinophils Relative: 1.1 % (ref 0.0–5.0)
HCT: 42.5 % (ref 36.0–46.0)
Hemoglobin: 14.1 g/dL (ref 12.0–15.0)
Lymphocytes Relative: 49.4 % — ABNORMAL HIGH (ref 12.0–46.0)
Lymphs Abs: 3.4 10*3/uL (ref 0.7–4.0)
MCHC: 33.2 g/dL (ref 30.0–36.0)
MCV: 90 fl (ref 78.0–100.0)
Monocytes Absolute: 0.4 10*3/uL (ref 0.1–1.0)
Monocytes Relative: 6.5 % (ref 3.0–12.0)
Neutro Abs: 2.9 10*3/uL (ref 1.4–7.7)
Neutrophils Relative %: 42.4 % — ABNORMAL LOW (ref 43.0–77.0)
Platelets: 224 10*3/uL (ref 150.0–400.0)
RBC: 4.72 Mil/uL (ref 3.87–5.11)
RDW: 13.4 % (ref 11.5–15.5)
WBC: 6.9 10*3/uL (ref 4.0–10.5)

## 2022-06-05 LAB — TSH: TSH: 1.92 u[IU]/mL (ref 0.35–5.50)

## 2022-06-05 NOTE — Patient Instructions (Signed)
Blue-Emu cream -- use 2-3 times a day ? ?

## 2022-06-05 NOTE — Progress Notes (Signed)
Subjective:  Patient ID: Kayla Tate, female    DOB: 12/25/1960  Age: 62 y.o. MRN: [id number]  CC: Annual Exam   HPI Kayla Tate presents for a well exam C/o knee pain B  Loose body - R  Outpatient Medications Prior to Visit  Medication Sig Dispense Refill   Cholecalciferol (VITAMIN D3) 50 MCG (2000 UT) capsule Take 1 capsule (2,000 Units total) by mouth daily. 100 capsule 3   No facility-administered medications prior to visit.    ROS: Review of Systems  Constitutional:  Negative for activity change, appetite change, chills, fatigue and unexpected weight change.  HENT:  Negative for congestion, mouth sores and sinus pressure.   Eyes:  Negative for visual disturbance.  Respiratory:  Negative for cough and chest tightness.   Gastrointestinal:  Negative for abdominal pain and nausea.  Genitourinary:  Negative for difficulty urinating, frequency and vaginal pain.  Musculoskeletal:  Negative for back pain and gait problem.  Skin:  Negative for pallor and rash.  Neurological:  Negative for dizziness, tremors, weakness, numbness and headaches.  Psychiatric/Behavioral:  Negative for confusion and sleep disturbance.     Objective:  BP 134/80 (BP Location: Left Arm, Patient Position: Sitting, Cuff Size: Large)   Pulse 65   Temp 97.6 F (36.4 C) (Temporal)   Ht 5' 3.25" (1.607 m)   Wt 131 lb (59.4 kg)   LMP 02/27/2014 Comment: SUPRACERVICAL HYSTERECTOMY   SpO2 98%   BMI 23.02 kg/m   BP Readings from Last 3 Encounters:  06/05/22 134/80  08/09/21 118/64  06/21/21 102/69    Wt Readings from Last 3 Encounters:  06/05/22 131 lb (59.4 kg)  08/09/21 131 lb (59.4 kg)  06/21/21 138 lb (62.6 kg)    Physical Exam Constitutional:      General: She is not in acute distress.    Appearance: Normal appearance. She is well-developed.  HENT:     Head: Normocephalic.     Right Ear: External ear normal.     Left Ear: External ear normal.     Nose: Nose normal.  Eyes:      General:        Right eye: No discharge.        Left eye: No discharge.     Conjunctiva/sclera: Conjunctivae normal.     Pupils: Pupils are equal, round, and reactive to light.  Neck:     Thyroid: No thyromegaly.     Vascular: No JVD.     Trachea: No tracheal deviation.  Cardiovascular:     Rate and Rhythm: Normal rate and regular rhythm.     Heart sounds: Normal heart sounds.  Pulmonary:     Effort: No respiratory distress.     Breath sounds: No stridor. No wheezing.  Abdominal:     General: Bowel sounds are normal. There is no distension.     Palpations: Abdomen is soft. There is no mass.     Tenderness: There is no abdominal tenderness. There is no guarding or rebound.  Musculoskeletal:        General: No tenderness.     Cervical back: Normal range of motion and neck supple. No rigidity.  Lymphadenopathy:     Cervical: No cervical adenopathy.  Skin:    Findings: No erythema or rash.  Neurological:     Cranial Nerves: No cranial nerve deficit.     Motor: No abnormal muscle tone.     Coordination: Coordination normal.     Deep Tendon Reflexes: Reflexes normal.  Psychiatric:        Behavior: Behavior normal.        Thought Content: Thought content normal.        Judgment: Judgment normal.     Lab Results  Component Value Date   WBC 6.9 06/05/2022   HGB 14.1 06/05/2022   HCT 42.5 06/05/2022   PLT 224.0 06/05/2022   GLUCOSE 89 06/05/2022   CHOL 238 (H) 06/05/2022   TRIG 72.0 06/05/2022   HDL 68.80 06/05/2022   LDLDIRECT 140.4 12/06/2010   LDLCALC 155 (H) 06/05/2022   ALT 22 06/05/2022   AST 35 06/05/2022   NA 140 06/05/2022   K 3.7 06/05/2022   CL 102 06/05/2022   CREATININE 0.75 06/05/2022   BUN 10 06/05/2022   CO2 27 06/05/2022   TSH 1.92 06/05/2022    MM 3D SCREEN BREAST BILATERAL  Result Date: 05/10/2021 CLINICAL DATA:  Screening. EXAM: DIGITAL SCREENING BILATERAL MAMMOGRAM WITH TOMOSYNTHESIS AND CAD TECHNIQUE: Bilateral screening digital  craniocaudal and mediolateral oblique mammograms were obtained. Bilateral screening digital breast tomosynthesis was performed. The images were evaluated with computer-aided detection. COMPARISON:  Previous exam(s). ACR Breast Density Category b: There are scattered areas of fibroglandular density. FINDINGS: There are no findings suspicious for malignancy. IMPRESSION: No mammographic evidence of malignancy. A result letter of this screening mammogram will be mailed directly to the patient. RECOMMENDATION: Screening mammogram in one year. (Code:SM-B-01Y) BI-RADS CATEGORY  1: Negative. Electronically Signed   By: Kristopher Oppenheim M.D.   On: 05/10/2021 13:45    Assessment & Plan:   Problem List Items Addressed This Visit       Other   Well adult exam - Primary    We discussed age appropriate health related issues, including available/recomended screening tests and vaccinations. Labs were ordered to be later reviewed . All questions were answered. We discussed one or more of the following - seat belt use, use of sunscreen/sun exposure exercise, fall risk reduction, second hand smoke exposure, firearm use and storage, seat belt use, a need for adhering to healthy diet and exercise. Labs were ordered.  All questions were answered.  Colon 2015, due in 2020 Opth, GYN, mammo - q 1 year Shingrix 2018 Declined other shots      Relevant Orders   TSH (Completed)   Urinalysis   CBC with Differential/Platelet (Completed)   Lipid panel (Completed)   Comprehensive metabolic panel (Completed)   Hepatitis A hepatitis B combined vaccine IM (Completed)   Knee pain, right    ?loose body Seeing ortho       Knee pain, left    L knee anter cruc lig and med meniscus and had a reconstruction surgery years ago.  Previous anterior cruciate  ligament repair L. Blue-Emu cream was recommended to use 2-3 times a day       Other Visit Diagnoses     Need for hepatitis A and B vaccination       Relevant Orders    Hepatitis A hepatitis B combined vaccine IM (Completed)         No orders of the defined types were placed in this encounter.     Follow-up: Return in about 1 year (around 06/06/2023) for Wellness Exam.  Walker Kehr, MD

## 2022-06-05 NOTE — Assessment & Plan Note (Addendum)
L knee anter cruc lig and med meniscus and had a reconstruction surgery years ago.  Previous anterior cruciate  ligament repair L. Blue-Emu cream was recommended to use 2-3 times a day

## 2022-06-05 NOTE — Assessment & Plan Note (Signed)
?  loose body Seeing ortho

## 2022-06-05 NOTE — Assessment & Plan Note (Signed)
We discussed age appropriate health related issues, including available/recomended screening tests and vaccinations. Labs were ordered to be later reviewed . All questions were answered. We discussed one or more of the following - seat belt use, use of sunscreen/sun exposure exercise, fall risk reduction, second hand smoke exposure, firearm use and storage, seat belt use, a need for adhering to healthy diet and exercise. Labs were ordered.  All questions were answered.  Colon 2015, due in 2020 Opth, GYN, mammo - q 1 year Shingrix 2018 Declined other shots

## 2022-06-06 LAB — COMPREHENSIVE METABOLIC PANEL
ALT: 22 U/L (ref 0–35)
AST: 35 U/L (ref 0–37)
Albumin: 4.7 g/dL (ref 3.5–5.2)
Alkaline Phosphatase: 82 U/L (ref 39–117)
BUN: 10 mg/dL (ref 6–23)
CO2: 27 mEq/L (ref 19–32)
Calcium: 10.1 mg/dL (ref 8.4–10.5)
Chloride: 102 mEq/L (ref 96–112)
Creatinine, Ser: 0.75 mg/dL (ref 0.40–1.20)
GFR: 86.71 mL/min (ref >60.00)
Glucose, Bld: 89 mg/dL (ref 70–99)
Potassium: 3.7 mEq/L (ref 3.5–5.1)
Sodium: 140 mEq/L (ref 135–145)
Total Bilirubin: 0.4 mg/dL (ref 0.2–1.2)
Total Protein: 7.5 g/dL (ref 6.0–8.3)

## 2022-06-06 LAB — LIPID PANEL
Cholesterol: 238 mg/dL — ABNORMAL HIGH (ref 0–200)
HDL: 68.8 mg/dL (ref >39.00)
LDL Cholesterol: 155 mg/dL — ABNORMAL HIGH (ref 0–99)
NonHDL: 169.43
Total CHOL/HDL Ratio: 3
Triglycerides: 72 mg/dL (ref 0.0–149.0)
VLDL: 14.4 mg/dL (ref 0.0–40.0)

## 2022-07-17 ENCOUNTER — Ambulatory Visit (HOSPITAL_BASED_OUTPATIENT_CLINIC_OR_DEPARTMENT_OTHER): Payer: BC Managed Care – PPO | Admitting: Orthopaedic Surgery

## 2022-08-01 ENCOUNTER — Other Ambulatory Visit: Payer: Self-pay | Admitting: Obstetrics & Gynecology

## 2022-08-01 ENCOUNTER — Ambulatory Visit (HOSPITAL_BASED_OUTPATIENT_CLINIC_OR_DEPARTMENT_OTHER): Payer: BC Managed Care – PPO | Admitting: Orthopaedic Surgery

## 2022-08-01 DIAGNOSIS — Z1231 Encounter for screening mammogram for malignant neoplasm of breast: Secondary | ICD-10-CM

## 2022-09-09 ENCOUNTER — Ambulatory Visit: Payer: BC Managed Care – PPO

## 2023-01-05 ENCOUNTER — Ambulatory Visit
Admission: RE | Admit: 2023-01-05 | Discharge: 2023-01-05 | Disposition: A | Discharge status: Home / Self Care | Payer: BC Managed Care – PPO | Source: Ambulatory Visit | Attending: Obstetrics & Gynecology | Admitting: Obstetrics & Gynecology

## 2023-01-05 DIAGNOSIS — Z1231 Encounter for screening mammogram for malignant neoplasm of breast: Secondary | ICD-10-CM

## 2023-06-01 ENCOUNTER — Encounter: Payer: BC Managed Care – PPO | Admitting: Internal Medicine

## 2023-06-09 DIAGNOSIS — Z01419 Encounter for gynecological examination (general) (routine) without abnormal findings: Secondary | ICD-10-CM | POA: Diagnosis not present

## 2023-06-18 ENCOUNTER — Encounter: Payer: BC Managed Care – PPO | Admitting: Internal Medicine

## 2023-07-20 ENCOUNTER — Encounter: Payer: Self-pay | Admitting: Internal Medicine

## 2023-07-20 ENCOUNTER — Ambulatory Visit (INDEPENDENT_AMBULATORY_CARE_PROVIDER_SITE_OTHER): Admitting: Internal Medicine

## 2023-07-20 VITALS — BP 120/74 | HR 61 | Temp 97.5°F | Ht 63.25 in | Wt 127.0 lb

## 2023-07-20 DIAGNOSIS — Z13220 Encounter for screening for lipoid disorders: Secondary | ICD-10-CM

## 2023-07-20 DIAGNOSIS — Z23 Encounter for immunization: Secondary | ICD-10-CM

## 2023-07-20 DIAGNOSIS — Z0000 Encounter for general adult medical examination without abnormal findings: Secondary | ICD-10-CM | POA: Diagnosis not present

## 2023-07-20 DIAGNOSIS — E559 Vitamin D deficiency, unspecified: Secondary | ICD-10-CM

## 2023-07-20 LAB — LIPID PANEL
Cholesterol: 218 mg/dL — ABNORMAL HIGH (ref 0–200)
HDL: 74.5 mg/dL (ref >39.00)
LDL Cholesterol: 134 mg/dL — ABNORMAL HIGH (ref 0–99)
NonHDL: 143.35
Total CHOL/HDL Ratio: 3
Triglycerides: 49 mg/dL (ref 0.0–149.0)
VLDL: 9.8 mg/dL (ref 0.0–40.0)

## 2023-07-20 LAB — URINALYSIS
Bilirubin Urine: NEGATIVE
Hgb urine dipstick: NEGATIVE
Ketones, ur: NEGATIVE
Leukocytes,Ua: NEGATIVE
Nitrite: NEGATIVE
Specific Gravity, Urine: <=1.005 — AB (ref 1.000–1.030)
Total Protein, Urine: NEGATIVE
Urine Glucose: NEGATIVE
Urobilinogen, UA: 0.2 (ref 0.0–1.0)
pH: 6 (ref 5.0–8.0)

## 2023-07-20 LAB — TSH: TSH: 2.83 u[IU]/mL (ref 0.35–5.50)

## 2023-07-20 LAB — COMPREHENSIVE METABOLIC PANEL WITH GFR
ALT: 21 U/L (ref 0–35)
AST: 33 U/L (ref 0–37)
Albumin: 4.3 g/dL (ref 3.5–5.2)
Alkaline Phosphatase: 79 U/L (ref 39–117)
BUN: 21 mg/dL (ref 6–23)
CO2: 28 meq/L (ref 19–32)
Calcium: 9.4 mg/dL (ref 8.4–10.5)
Chloride: 104 meq/L (ref 96–112)
Creatinine, Ser: 0.77 mg/dL (ref 0.40–1.20)
GFR: 83.36 mL/min (ref >60.00)
Glucose, Bld: 95 mg/dL (ref 70–99)
Potassium: 4.7 meq/L (ref 3.5–5.1)
Sodium: 140 meq/L (ref 135–145)
Total Bilirubin: 0.5 mg/dL (ref 0.2–1.2)
Total Protein: 7.1 g/dL (ref 6.0–8.3)

## 2023-07-20 LAB — CBC WITH DIFFERENTIAL/PLATELET
Basophils Absolute: 0 10*3/uL (ref 0.0–0.1)
Basophils Relative: 0.8 % (ref 0.0–3.0)
Eosinophils Absolute: 0.1 10*3/uL (ref 0.0–0.7)
Eosinophils Relative: 1.2 % (ref 0.0–5.0)
HCT: 41.5 % (ref 36.0–46.0)
Hemoglobin: 13.5 g/dL (ref 12.0–15.0)
Lymphocytes Relative: 53.4 % — ABNORMAL HIGH (ref 12.0–46.0)
Lymphs Abs: 2.9 10*3/uL (ref 0.7–4.0)
MCHC: 32.6 g/dL (ref 30.0–36.0)
MCV: 91 fl (ref 78.0–100.0)
Monocytes Absolute: 0.5 10*3/uL (ref 0.1–1.0)
Monocytes Relative: 8.7 % (ref 3.0–12.0)
Neutro Abs: 2 10*3/uL (ref 1.4–7.7)
Neutrophils Relative %: 35.9 % — ABNORMAL LOW (ref 43.0–77.0)
Platelets: 199 10*3/uL (ref 150.0–400.0)
RBC: 4.56 Mil/uL (ref 3.87–5.11)
RDW: 13.6 % (ref 11.5–15.5)
WBC: 5.5 10*3/uL (ref 4.0–10.5)

## 2023-07-20 LAB — VITAMIN D 25 HYDROXY (VIT D DEFICIENCY, FRACTURES): VITD: 46.39 ng/mL (ref 30.00–100.00)

## 2023-07-20 NOTE — Assessment & Plan Note (Addendum)
 We discussed age appropriate health related issues, including available/recomended screening tests and vaccinations. Labs were ordered to be later reviewed . All questions were answered. We discussed one or more of the following - seat belt use, use of sunscreen/sun exposure exercise, fall risk reduction, second hand smoke exposure, firearm use and storage, seat belt use, a need for adhering to healthy diet and exercise. Labs were ordered.  All questions were answered.  Colon 2015, 2023, due in 2030 Opth, GYN, mammo - q 1 year Shingrix 2018 Declined other shots tDAP

## 2023-07-20 NOTE — Progress Notes (Signed)
 Subjective:  Patient ID: Kayla Tate, female    DOB: September 29, 1960  Age: 63 y.o. MRN: [id number]  CC: Annual Exam   HPI Kayla Tate presents for a well exam  Outpatient Medications Prior to Visit  Medication Sig Dispense Refill   Cholecalciferol (VITAMIN D3) 50 MCG (2000 UT) capsule Take 1 capsule (2,000 Units total) by mouth daily. 100 capsule 3   No facility-administered medications prior to visit.    ROS: Review of Systems  Constitutional:  Negative for activity change, appetite change, chills, fatigue and unexpected weight change.  HENT:  Negative for congestion, mouth sores and sinus pressure.   Eyes:  Negative for visual disturbance.  Respiratory:  Negative for cough and chest tightness.   Gastrointestinal:  Negative for abdominal pain and nausea.  Genitourinary:  Negative for difficulty urinating, frequency and vaginal pain.  Musculoskeletal:  Negative for back pain and gait problem.  Skin:  Negative for pallor and rash.  Neurological:  Negative for dizziness, tremors, weakness, numbness and headaches.  Psychiatric/Behavioral:  Negative for confusion and sleep disturbance.     Objective:  BP 120/74   Pulse 61   Temp (!) 97.5 F (36.4 C)   Ht 5' 3.25" (1.607 m)   Wt 127 lb (57.6 kg)   LMP 02/27/2014 Comment: SUPRACERVICAL HYSTERECTOMY   BMI 22.32 kg/m   BP Readings from Last 3 Encounters:  07/20/23 120/74  06/05/22 134/80  08/09/21 118/64    Wt Readings from Last 3 Encounters:  07/20/23 127 lb (57.6 kg)  06/05/22 131 lb (59.4 kg)  08/09/21 131 lb (59.4 kg)    Physical Exam Constitutional:      General: She is not in acute distress.    Appearance: Normal appearance. She is well-developed.  HENT:     Head: Normocephalic.     Right Ear: External ear normal.     Left Ear: External ear normal.     Nose: Nose normal.  Eyes:     General:        Right eye: No discharge.        Left eye: No discharge.     Conjunctiva/sclera: Conjunctivae  normal.     Pupils: Pupils are equal, round, and reactive to light.  Neck:     Thyroid: No thyromegaly.     Vascular: No JVD.     Trachea: No tracheal deviation.  Cardiovascular:     Rate and Rhythm: Normal rate and regular rhythm.     Heart sounds: Normal heart sounds.  Pulmonary:     Effort: No respiratory distress.     Breath sounds: No stridor. No wheezing.  Abdominal:     General: Bowel sounds are normal. There is no distension.     Palpations: Abdomen is soft. There is no mass.     Tenderness: There is no abdominal tenderness. There is no guarding or rebound.  Musculoskeletal:        General: No tenderness.     Cervical back: Normal range of motion and neck supple. No rigidity.  Lymphadenopathy:     Cervical: No cervical adenopathy.  Skin:    Findings: No erythema or rash.  Neurological:     Cranial Nerves: No cranial nerve deficit.     Motor: No abnormal muscle tone.     Coordination: Coordination normal.     Deep Tendon Reflexes: Reflexes normal.  Psychiatric:        Behavior: Behavior normal.        Thought Content: Thought content  normal.        Judgment: Judgment normal.     Lab Results  Component Value Date   WBC 6.9 06/05/2022   HGB 14.1 06/05/2022   HCT 42.5 06/05/2022   PLT 224.0 06/05/2022   GLUCOSE 89 06/05/2022   CHOL 238 (H) 06/05/2022   TRIG 72.0 06/05/2022   HDL 68.80 06/05/2022   LDLDIRECT 140.4 12/06/2010   LDLCALC 155 (H) 06/05/2022   ALT 22 06/05/2022   AST 35 06/05/2022   NA 140 06/05/2022   K 3.7 06/05/2022   CL 102 06/05/2022   CREATININE 0.75 06/05/2022   BUN 10 06/05/2022   CO2 27 06/05/2022   TSH 1.92 06/05/2022    MM 3D SCREENING MAMMOGRAM BILATERAL BREAST Result Date: 01/06/2023 CLINICAL DATA:  Screening. EXAM: DIGITAL SCREENING BILATERAL MAMMOGRAM WITH TOMOSYNTHESIS AND CAD TECHNIQUE: Bilateral screening digital craniocaudal and mediolateral oblique mammograms were obtained. Bilateral screening digital breast tomosynthesis  was performed. The images were evaluated with computer-aided detection. COMPARISON:  Previous exam(s). ACR Breast Density Category b: There are scattered areas of fibroglandular density. FINDINGS: There are no findings suspicious for malignancy. IMPRESSION: No mammographic evidence of malignancy. A result letter of this screening mammogram will be mailed directly to the patient. RECOMMENDATION: Screening mammogram in one year. (Code:SM-B-01Y) BI-RADS CATEGORY  1: Negative. Electronically Signed   By: Edwin Cap M.D.   On: 01/06/2023 15:58    Assessment & Plan:   Problem List Items Addressed This Visit     Well adult exam - Primary   We discussed age appropriate health related issues, including available/recomended screening tests and vaccinations. Labs were ordered to be later reviewed . All questions were answered. We discussed one or more of the following - seat belt use, use of sunscreen/sun exposure exercise, fall risk reduction, second hand smoke exposure, firearm use and storage, seat belt use, a need for adhering to healthy diet and exercise. Labs were ordered.  All questions were answered.  Colon 2015, 2023, due in 2030 Opth, GYN, mammo - q 1 year Shingrix 2018 Declined other shots tDAP      Relevant Orders   TSH   Urinalysis   CBC with Differential/Platelet   Lipid panel   Comprehensive metabolic panel with GFR   VITAMIN D 25 Hydroxy (Vit-D Deficiency, Fractures)   Vitamin D deficiency   On Vit D      Relevant Orders   VITAMIN D 25 Hydroxy (Vit-D Deficiency, Fractures)      No orders of the defined types were placed in this encounter.     Follow-up: Return in about 1 year (around 07/19/2024) for a follow-up visit.  Sonda Primes, MD

## 2023-07-20 NOTE — Assessment & Plan Note (Signed)
 On Vit D

## 2023-07-20 NOTE — Addendum Note (Signed)
 Addended byParticia Nearing on: 07/20/2023 08:30 AM   Modules accepted: Orders

## 2023-07-21 ENCOUNTER — Encounter: Payer: Self-pay | Admitting: Internal Medicine

## 2023-09-25 DIAGNOSIS — Z0110 Encounter for examination of ears and hearing without abnormal findings: Secondary | ICD-10-CM | POA: Diagnosis not present

## 2023-09-25 NOTE — Progress Notes (Signed)
 Kayla Tate is a 63 year old female who comes in with a concern that a bug flew into her left ear about an hour ago.  Immediately following the incident she irrigated the ear and states that nothing came out.  She still has a sensation that something is in there.  She denies any hearing loss, ear pain, buzzing sensation or foreign body sensation or plugged sensation in the left ear.  Vital signs reviewed External inspection of bilateral ears is normal with no abnormalities.  No tenderness to palpation of either ear.  No drainage on either ear.  Right ear with pearly gray tympanic membrane.  Left ear with no wax and pearly gray tympanic membrane.  No erythema.  No foreign body identified within the ear canal.  TM is intact with no signs of perforation.  A/P Provided reassurance to patient that there is no foreign body in the left ear canal.

## 2023-11-20 ENCOUNTER — Ambulatory Visit (HOSPITAL_BASED_OUTPATIENT_CLINIC_OR_DEPARTMENT_OTHER): Admitting: Orthopaedic Surgery

## 2023-11-20 ENCOUNTER — Ambulatory Visit (INDEPENDENT_AMBULATORY_CARE_PROVIDER_SITE_OTHER)

## 2023-11-20 DIAGNOSIS — M1711 Unilateral primary osteoarthritis, right knee: Secondary | ICD-10-CM | POA: Diagnosis not present

## 2023-11-20 DIAGNOSIS — M25561 Pain in right knee: Secondary | ICD-10-CM

## 2023-11-20 DIAGNOSIS — M25562 Pain in left knee: Secondary | ICD-10-CM | POA: Diagnosis not present

## 2023-11-20 DIAGNOSIS — G8929 Other chronic pain: Secondary | ICD-10-CM

## 2023-11-20 DIAGNOSIS — M1712 Unilateral primary osteoarthritis, left knee: Secondary | ICD-10-CM | POA: Diagnosis not present

## 2023-11-20 DIAGNOSIS — M25461 Effusion, right knee: Secondary | ICD-10-CM | POA: Diagnosis not present

## 2023-11-20 DIAGNOSIS — M25462 Effusion, left knee: Secondary | ICD-10-CM | POA: Diagnosis not present

## 2023-11-20 MED ORDER — LIDOCAINE HCL 1 % IJ SOLN
4.0000 mL | INTRAMUSCULAR | Status: AC | PRN
  Administered 2023-11-20: 4 mL

## 2023-11-20 MED ORDER — TRIAMCINOLONE ACETONIDE 40 MG/ML IJ SUSP
80.0000 mg | INTRAMUSCULAR | Status: AC | PRN
  Administered 2023-11-20: 80 mg via INTRA_ARTICULAR

## 2023-11-20 NOTE — Progress Notes (Addendum)
 Chief Complaint: Bilateral knee pain     History of Present Illness:    Kayla Tate is a 63 y.o. female right worse than left knee pain.  With regard to the left knee she does have a history of ACL reconstruction which was done because extend in the 1980s.  Since this time she has been having some popping and clicking in the knee although she does continue to guard the knee and as result has not had significant pain in this.  With regard to the right knee she is experiencing tenderness prominently immediately.  She was diagnosed with a meniscal tear and previous imaging done at [facility].  She is here today for further discussion she has trialed strengthening of the right leg without any improvement    PMH/PSH/Family History/Social History/Meds/Allergies:    Past Medical History:  Diagnosis Date  . Anemia    iron def  . Blood transfusion without reported diagnosis    2 YEARS AGO  . Wrist fracture, right 2007   Past Surgical History:  Procedure Laterality Date  . ABDOMINAL HYSTERECTOMY    . BIOPSY BREAST  1998  . COLONOSCOPY    . KNEE LIGAMENT RECONSTRUCTION  1985   Left  . MOUTH SURGERY    . POLYPECTOMY    . TOOTH EXTRACTION  2023  . XI ROBOTIC ASSISTED TOTAL HYSTERECTOMY WITH SACROCOLPOPEXY N/A 08/16/2019   Procedure: XI ROBOTIC ASSISTED SUPRACERVICAL HYSTERECTOMY WITH BILATERAL SALPINGO OOPHERECTOMY AND SACROCOLPOPEXY;  Surgeon: Lavoie, Marie-Lyne, MD;  Location: [facility] [place];  Service: Gynecology;  Laterality: N/A;   Social History   Socioeconomic History  . Marital status: Married    Spouse name: Not on file  . Number of children: 2  . Years of education: Not on file  . Highest education level: Not on file  Occupational History  . Occupation: Airline pilot  Tobacco Use  . Smoking status: Never    Passive exposure: Never  . Smokeless tobacco: Never  Vaping Use  . Vaping status: Never Used  Substance and Sexual Activity  . Alcohol use: No     Alcohol/week: 0.0 standard drinks of alcohol  . Drug use: No  . Sexual activity: Yes    Partners: Male    Birth control/protection: Surgical    Comment: hysterectomy  Other Topics Concern  . Not on file  Social History Narrative   H/o parachute sports   Social Drivers of Corporate investment banker Strain: Not on file  Food Insecurity: Not on file  Transportation Needs: Not on file  Physical Activity: Not on file  Stress: Not on file  Social Connections: Not on file   Family History  Problem Relation Age of Onset  . Heart disease Mother 45       CAD  . Hypertension Mother 85       CAD  . Pneumonia Father   . Colon cancer Neg Hx   . Esophageal cancer Neg Hx   . Stomach cancer Neg Hx   . Rectal cancer Neg Hx   . Colon polyps Neg Hx    Allergies  Allergen Reactions  . Varicella-Zoster Immune Glob Rash    Other reaction(s): Fever (intolerance) Local reaction   Current Outpatient Medications  Medication Sig Dispense Refill  . Cholecalciferol (VITAMIN D3) 50 MCG (2000 UT) capsule Take 1 capsule (2,000 Units total) by mouth daily. 100 capsule 3   No current facility-administered medications for this visit.   No results found.  Review of  Systems:   A ROS was performed including pertinent positives and negatives as documented in the HPI.  Physical Exam :   Constitutional: NAD and appears stated age Neurological: Alert and oriented Psych: Appropriate affect and cooperative   Comprehensive Musculoskeletal Exam:    Right knee with tenderness about the medial joint line, positive McMurray, range of motion is -3 to 135 degrees.  Distal neurosensory exam is intact, negative Lachman, negative posterior drawer  Left knee with medial joint line tenderness with crepitus as she ranges from 0 to 100 degrees negative Lachman negative posterior drawer no varus or valgus laxity   Imaging:   Xray (4 views right knee, 4 views left knee): Left knee with moderate to advanced  osteoarthritis, right knee is normal    I personally reviewed and interpreted the radiographs.   Assessment and Plan:   63 y.o. female with a known right knee medial meniscal tear that is progressively painful and worsening over the last several years.  Given this I would like to obtain an updated MRI to see if there has been any progression of chondromalacia or chondral thinning to see if she would ultimately be a candidate for arthroscopic intervention.  With regard to the left knee this is symptomatic although she is doing quite well with it.  We did discuss treatment options including injections although I do ultimately believe she may be candidate for knee arthroplasty.  At this time we will plan to proceed with a right knee MRI and follow-up discuss results  -Plan for MRI right knee and follow-up discuss results, she is also electing for an injection at today's visit as well    Procedure Note  Patient: Kayla Tate             Date of Birth: 1961/04/06           MRN: [id number]             Visit Date: 11/20/2023  Procedures: Visit Diagnoses:  1. Chronic pain of left knee   2. Chronic pain of right knee     Large Joint Inj: R knee on 11/20/2023 3:44 PM Indications: pain Details: 22 G 1.5 in needle, ultrasound-guided anterior approach  Arthrogram: No  Medications: 4 mL lidocaine  1 %; 80 mg triamcinolone acetonide 40 MG/ML Outcome: tolerated well, no immediate complications Procedure, treatment alternatives, risks and benefits explained, specific risks discussed. Consent was given by the patient. Immediately prior to procedure a time out was called to verify the correct patient, procedure, equipment, support staff and site/side marked as required. Patient was prepped and draped in the usual sterile fashion.          I personally saw and evaluated the patient, and participated in the management and treatment plan.  Elspeth Parker, MD Attending Physician, Orthopedic  Surgery  This document was dictated using Dragon voice recognition software. A reasonable attempt at proof reading has been made to minimize errors.

## 2023-11-20 NOTE — Addendum Note (Signed)
 Addended by: WOLFGANG CONLEY HERO on: 11/20/2023 03:44 PM   Modules accepted: Orders

## 2023-11-25 ENCOUNTER — Encounter (HOSPITAL_BASED_OUTPATIENT_CLINIC_OR_DEPARTMENT_OTHER): Payer: Self-pay | Admitting: Orthopaedic Surgery

## 2023-11-27 ENCOUNTER — Ambulatory Visit (HOSPITAL_BASED_OUTPATIENT_CLINIC_OR_DEPARTMENT_OTHER): Admitting: Orthopaedic Surgery

## 2023-12-02 ENCOUNTER — Ambulatory Visit
Admission: RE | Admit: 2023-12-02 | Discharge: 2023-12-02 | Disposition: A | Discharge status: Home / Self Care | Source: Ambulatory Visit | Attending: Orthopaedic Surgery | Admitting: Orthopaedic Surgery

## 2023-12-02 DIAGNOSIS — G8929 Other chronic pain: Secondary | ICD-10-CM

## 2023-12-03 ENCOUNTER — Other Ambulatory Visit

## 2023-12-16 ENCOUNTER — Ambulatory Visit (HOSPITAL_BASED_OUTPATIENT_CLINIC_OR_DEPARTMENT_OTHER): Admitting: Orthopaedic Surgery

## 2023-12-25 ENCOUNTER — Ambulatory Visit (INDEPENDENT_AMBULATORY_CARE_PROVIDER_SITE_OTHER): Admitting: Orthopaedic Surgery

## 2023-12-25 ENCOUNTER — Ambulatory Visit (HOSPITAL_BASED_OUTPATIENT_CLINIC_OR_DEPARTMENT_OTHER): Payer: Self-pay | Admitting: Orthopaedic Surgery

## 2023-12-25 DIAGNOSIS — G8929 Other chronic pain: Secondary | ICD-10-CM

## 2023-12-25 DIAGNOSIS — M25561 Pain in right knee: Secondary | ICD-10-CM | POA: Diagnosis not present

## 2023-12-25 DIAGNOSIS — M25562 Pain in left knee: Secondary | ICD-10-CM

## 2023-12-25 NOTE — Progress Notes (Signed)
 Chief Complaint: Bilateral knee pain     History of Present Illness:   12/25/2023: Presents today for MRI discussion particularly of the right knee.  Kayla Tate is a 63 y.o. female right worse than left knee pain.  With regard to the left knee she does have a history of ACL reconstruction which was done because extend in the 1980s.  Since this time she has been having some popping and clicking in the knee although she does continue to guard the knee and as result has not had significant pain in this.  With regard to the right knee she is experiencing tenderness prominently immediately.  She was diagnosed with a meniscal tear and previous imaging done at [facility].  She is here today for further discussion she has trialed strengthening of the right leg without any improvement    PMH/PSH/Family History/Social History/Meds/Allergies:    Past Medical History:  Diagnosis Date   Anemia    iron def   Blood transfusion without reported diagnosis    2 YEARS AGO   Wrist fracture, right 2007   Past Surgical History:  Procedure Laterality Date   ABDOMINAL HYSTERECTOMY     BIOPSY BREAST  1998   COLONOSCOPY     KNEE LIGAMENT RECONSTRUCTION  1985   Left   MOUTH SURGERY     POLYPECTOMY     TOOTH EXTRACTION  2023   XI ROBOTIC ASSISTED TOTAL HYSTERECTOMY WITH SACROCOLPOPEXY N/A 08/16/2019   Procedure: XI ROBOTIC ASSISTED SUPRACERVICAL HYSTERECTOMY WITH BILATERAL SALPINGO OOPHERECTOMY AND SACROCOLPOPEXY;  Surgeon: Lavoie, Marie-Lyne, MD;  Location: [facility] [place];  Service: Gynecology;  Laterality: N/A;   Social History   Socioeconomic History   Marital status: Married    Spouse name: Not on file   Number of children: 2   Years of education: Not on file   Highest education level: Not on file  Occupational History   Occupation: accountant  Tobacco Use   Smoking status: Never    Passive exposure: Never   Smokeless tobacco: Never  Vaping Use   Vaping status:  Never Used  Substance and Sexual Activity   Alcohol use: No    Alcohol/week: 0.0 standard drinks of alcohol   Drug use: No   Sexual activity: Yes    Partners: Male    Birth control/protection: Surgical    Comment: hysterectomy  Other Topics Concern   Not on file  Social History Narrative   H/o parachute sports   Social Drivers of Corporate investment banker Strain: Not on file  Food Insecurity: Not on file  Transportation Needs: Not on file  Physical Activity: Not on file  Stress: Not on file  Social Connections: Not on file   Family History  Problem Relation Age of Onset   Heart disease Mother 67       CAD   Hypertension Mother 48       CAD   Pneumonia Father    Colon cancer Neg Hx    Esophageal cancer Neg Hx    Stomach cancer Neg Hx    Rectal cancer Neg Hx    Colon polyps Neg Hx    Allergies  Allergen Reactions   Varicella-Zoster Immune Glob Rash    Other reaction(s): Fever (intolerance) Local reaction   Current Outpatient Medications  Medication Sig Dispense Refill   Cholecalciferol (VITAMIN D3) 50 MCG (2000 UT) capsule Take 1 capsule (2,000 Units total) by mouth daily. 100 capsule 3   No current facility-administered medications  for this visit.   No results found.  Review of Systems:   A ROS was performed including pertinent positives and negatives as documented in the HPI.  Physical Exam :   Constitutional: NAD and appears stated age Neurological: Alert and oriented Psych: Appropriate affect and cooperative   Comprehensive Musculoskeletal Exam:    Right knee with tenderness about the medial joint line, positive McMurray, range of motion is -3 to 135 degrees.  Distal neurosensory exam is intact, negative Lachman, negative posterior drawer  Left knee with medial joint line tenderness with crepitus as she ranges from 0 to 100 degrees negative Lachman negative posterior drawer no varus or valgus laxity   Imaging:   Xray (4 views right knee, 4 views  left knee): Left knee with moderate to advanced osteoarthritis, right knee is normal  MRI right knee: Medial meniscal tearing with evidence of medial meniscal extrusion  I personally reviewed and interpreted the radiographs.   Assessment and Plan:   63 y.o. female with a known right knee medial meniscal tear that is progressively painful and worsening over the last several years.  Given this I would like to obtain an updated MRI to see if there has been any progression of chondromalacia or chondral thinning to see if she would ultimately be a candidate for arthroscopic intervention.  Her MRI does show evidence of minimal chondral loss and tearing of the medial meniscus.  I did discuss that overall I do believe she would be a candidate for meniscal centralization as right now her meniscus is essentially insufficient and could progress future chondral loss requiring the need for knee arthroplasty.  In order to hopefully avoid this I did discuss the role for knee arthroscopy with meniscal centralization.  I discussed risks and limitations after discussion she would like to proceed  - Plan for right knee arthroscopy with meniscal centralization   After a lengthy discussion of treatment options, including risks, benefits, alternatives, complications of surgical and nonsurgical conservative options, the patient elected surgical repair.   The patient  is aware of the material risks  and complications including, but not limited to injury to adjacent structures, neurovascular injury, infection, numbness, bleeding, implant failure, thermal burns, stiffness, persistent pain, failure to heal, disease transmission from allograft, need for further surgery, dislocation, anesthetic risks, blood clots, risks of death,and others. The probabilities of surgical success and failure discussed with patient given their particular co-morbidities.The time and nature of expected rehabilitation and recovery was discussed.The  patient's questions were all answered preoperatively.  No barriers to understanding were noted. I explained the natural history of the disease process and Rx rationale.  I explained to the patient what I considered to be reasonable expectations given their personal situation.  The final treatment plan was arrived at through a shared patient decision making process model.     I personally saw and evaluated the patient, and participated in the management and treatment plan.  Elspeth Parker, MD Attending Physician, Orthopedic Surgery  This document was dictated using Dragon voice recognition software. A reasonable attempt at proof reading has been made to minimize errors.

## 2024-01-11 ENCOUNTER — Telehealth: Payer: Self-pay | Admitting: Orthopaedic Surgery

## 2024-01-11 NOTE — Telephone Encounter (Signed)
 FYI: I spoke with the patient is regards to scheduling surgery. Per patient, she has changed her mind and does not wish to schedule surgery. Patient knows if she wishes to schedule in the future to give us  a call.

## 2024-02-08 ENCOUNTER — Ambulatory Visit (HOSPITAL_BASED_OUTPATIENT_CLINIC_OR_DEPARTMENT_OTHER): Admitting: Physical Therapy

## 2024-02-22 ENCOUNTER — Encounter: Payer: Self-pay | Admitting: Radiology

## 2024-05-04 ENCOUNTER — Ambulatory Visit (HOSPITAL_BASED_OUTPATIENT_CLINIC_OR_DEPARTMENT_OTHER): Admitting: Student

## 2024-05-04 ENCOUNTER — Ambulatory Visit (HOSPITAL_BASED_OUTPATIENT_CLINIC_OR_DEPARTMENT_OTHER)

## 2024-05-04 DIAGNOSIS — M25562 Pain in left knee: Secondary | ICD-10-CM

## 2024-05-04 DIAGNOSIS — M1712 Unilateral primary osteoarthritis, left knee: Secondary | ICD-10-CM | POA: Diagnosis not present

## 2024-05-04 MED ORDER — BETAMETHASONE SOD PHOS & ACET 6 (3-3) MG/ML IJ SUSP
2.0000 mL | INTRAMUSCULAR | Status: AC | PRN
  Administered 2024-05-04: 2 mL via INTRA_ARTICULAR

## 2024-05-04 MED ORDER — LIDOCAINE HCL 1 % IJ SOLN
4.0000 mL | INTRAMUSCULAR | Status: AC | PRN
  Administered 2024-05-04: 4 mL

## 2024-05-04 NOTE — Progress Notes (Signed)
 "                                Chief Complaint: Left knee pain     History of Present Illness:    Kayla Tate is a 64 y.o. female who presents today for evaluation of left knee pain.  She is accompanied by her son.  She states that this began 2 days ago after participating in a yoga class although she does not recall an injury.  Pain is worse medially but is present on both sides of the knee.  She does feel like the knee is swollen and is warm to touch.  She has been taking ibuprofen as well as using a lidocaine  patch.  She does have known history of left knee osteoarthritis and has previously been seen by Dr. Genelle.  Has not had an injection in the left knee.  She denies any fever or chills.   Surgical History:   Left knee ACL reconstruction 1985  PMH/PSH/Family History/Social History/Meds/Allergies:    Past Medical History:  Diagnosis Date   Anemia    iron def   Blood transfusion without reported diagnosis    2 YEARS AGO   Wrist fracture, right 2007   Past Surgical History:  Procedure Laterality Date   ABDOMINAL HYSTERECTOMY     BIOPSY BREAST  1998   COLONOSCOPY     KNEE LIGAMENT RECONSTRUCTION  1985   Left   MOUTH SURGERY     POLYPECTOMY     TOOTH EXTRACTION  2023   XI ROBOTIC ASSISTED TOTAL HYSTERECTOMY WITH SACROCOLPOPEXY N/A 08/16/2019   Procedure: XI ROBOTIC ASSISTED SUPRACERVICAL HYSTERECTOMY WITH BILATERAL SALPINGO OOPHERECTOMY AND SACROCOLPOPEXY;  Surgeon: Lavoie, Marie-Lyne, MD;  Location: [facility] [place];  Service: Gynecology;  Laterality: N/A;   Social History   Socioeconomic History   Marital status: Married    Spouse name: Not on file   Number of children: 2   Years of education: Not on file   Highest education level: Not on file  Occupational History   Occupation: accountant  Tobacco Use   Smoking status: Never    Passive exposure: Never   Smokeless tobacco: Never  Vaping Use   Vaping status: Never Used  Substance and  Sexual Activity   Alcohol use: No    Alcohol/week: 0.0 standard drinks of alcohol   Drug use: No   Sexual activity: Yes    Partners: Male    Birth control/protection: Surgical    Comment: hysterectomy  Other Topics Concern   Not on file  Social History Narrative   H/o parachute sports   Social Drivers of Health   Tobacco Use: Low Risk (07/20/2023)   Patient History    Smoking Tobacco Use: Never    Smokeless Tobacco Use: Never    Passive Exposure: Never  Financial Resource Strain: Not on file  Food Insecurity: Not on file  Transportation Needs: Not on file  Physical Activity: Not on file  Stress: Not on file  Social Connections: Not on file  Depression (PHQ2-9): Low Risk (07/20/2023)   Depression (PHQ2-9)    PHQ-2 Score: 0  Alcohol Screen: Not on file  Housing: Not on file  Utilities: Not on file  Health Literacy: Not on file   Family History  Problem Relation Age of Onset   Heart disease Mother 60       CAD   Hypertension Mother 76  CAD   Pneumonia Father    Colon cancer Neg Hx    Esophageal cancer Neg Hx    Stomach cancer Neg Hx    Rectal cancer Neg Hx    Colon polyps Neg Hx    Allergies[1] Current Outpatient Medications  Medication Sig Dispense Refill   Cholecalciferol (VITAMIN D3) 50 MCG (2000 UT) capsule Take 1 capsule (2,000 Units total) by mouth daily. 100 capsule 3   No current facility-administered medications for this visit.   No results found.  Review of Systems:   A ROS was performed including pertinent positives and negatives as documented in the HPI.  Physical Exam :   Constitutional: NAD and appears stated age Neurological: Alert and oriented Psych: Appropriate affect and cooperative Last menstrual period 02/27/2014.   Comprehensive Musculoskeletal Exam:    Exam of the left knee demonstrates presence of a moderate effusion without overlying erythema or warmth.  Tenderness along both medial and lateral joint line.  Active range of  motion is from 5 to 90 degrees with palpable crepitus.  No laxity with varus or valgus stress.  Imaging:   Xray (left knee 4 views): Moderate to advanced tricompartmental osteoarthritis with almost complete loss of joint space in the medial compartment.  There is a notable effusion present.  Diffuse chondrocalcinosis and osteophytes.   I personally reviewed and interpreted the radiographs.   Assessment:   64 y.o. female with tricompartmental left knee osteoarthritis that is nearing bone-on-bone in the medial compartment.  There is also diffuse chondrocalcinosis.  Patient's knee became acutely painful and swollen 2 days ago without any known injury and I suspect this is consistent with an osteoarthritis flare.  Discussed treatment options today and evaluated the knee under ultrasound which did demonstrate the presence of a moderate effusion.  Decision was made to proceed with aspiration and injection, and I was able to aspirate 42 mL of clear normal-appearing synovial fluid, followed by cortisone injection.  She tolerated this procedure very well.  We can plan to have her follow-up as needed, particularly if the symptoms persist or recur.  Patient is aware that she will likely need to consider total knee replacement in the future.  Plan :    - Left knee aspiration injection performed today and follow-up as needed    Procedure Note  Patient: Kayla Tate             Date of Birth: 13-Dec-1960           MRN: [id number]             Visit Date: 05/04/2024  Procedures: Visit Diagnoses:  1. Unilateral primary osteoarthritis, left knee     Large Joint Inj: L knee on 05/04/2024 3:11 PM Indications: pain Details: 18 G 1.5 in needle, superolateral approach Medications: 4 mL lidocaine  1 %; 2 mL betamethasone  acetate-betamethasone  sodium phosphate  6 (3-3) MG/ML Aspirate: 42 mL clear Outcome: tolerated well, no immediate complications Procedure, treatment alternatives, risks and benefits  explained, specific risks discussed. Consent was given by the patient. Immediately prior to procedure a time out was called to verify the correct patient, procedure, equipment, support staff and site/side marked as required. Patient was prepped and draped in the usual sterile fashion.       I personally saw and evaluated the patient, and participated in the management and treatment plan.  Leonce Reveal, PA-C Orthopedics    [1]  Allergies Allergen Reactions   Varicella-Zoster Immune Glob Rash    Other reaction(s): Fever (intolerance)  Local reaction   "
